# Patient Record
Sex: Male | Born: 1985 | Race: White | Hispanic: No | Marital: Single | Smoking: Current every day smoker
Health system: Southern US, Community
[De-identification: ages and names within clinical notes are randomized; demographics above are authoritative.]

## PROBLEM LIST (undated history)

## (undated) DIAGNOSIS — Z87442 Personal history of urinary calculi: Secondary | ICD-10-CM

## (undated) DIAGNOSIS — F419 Anxiety disorder, unspecified: Secondary | ICD-10-CM

## (undated) DIAGNOSIS — R519 Headache, unspecified: Secondary | ICD-10-CM

## (undated) DIAGNOSIS — T6701XA Heatstroke and sunstroke, initial encounter: Secondary | ICD-10-CM

## (undated) DIAGNOSIS — R51 Headache: Secondary | ICD-10-CM

## (undated) DIAGNOSIS — N200 Calculus of kidney: Secondary | ICD-10-CM

## (undated) DIAGNOSIS — Z8489 Family history of other specified conditions: Secondary | ICD-10-CM

## (undated) HISTORY — PX: KIDNEY STONE SURGERY: SHX686

---
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
## 2006-01-26 ENCOUNTER — Emergency Department: Payer: Self-pay | Admitting: Unknown Physician Specialty

## 2006-01-29 ENCOUNTER — Emergency Department: Payer: Self-pay | Admitting: Emergency Medicine

## 2006-02-02 ENCOUNTER — Emergency Department: Payer: Self-pay

## 2008-02-29 ENCOUNTER — Emergency Department: Payer: Self-pay | Admitting: Emergency Medicine

## 2012-07-13 DIAGNOSIS — T6701XA Heatstroke and sunstroke, initial encounter: Secondary | ICD-10-CM

## 2012-07-13 HISTORY — DX: Heatstroke and sunstroke, initial encounter: T67.01XA

## 2013-11-22 ENCOUNTER — Emergency Department: Payer: Self-pay | Admitting: Emergency Medicine

## 2013-11-22 LAB — URINALYSIS, COMPLETE
Bacteria: NONE SEEN
Bilirubin,UR: NEGATIVE
Blood: NEGATIVE
Glucose,UR: NEGATIVE mg/dL (ref 0–75)
KETONE: NEGATIVE
LEUKOCYTE ESTERASE: NEGATIVE
Nitrite: NEGATIVE
PROTEIN: NEGATIVE
Ph: 5 (ref 4.5–8.0)
Specific Gravity: 1.013 (ref 1.003–1.030)
Squamous Epithelial: <1
WBC UR: 3 /HPF (ref 0–5)

## 2013-11-22 LAB — LIPASE, BLOOD: Lipase: 170 U/L (ref 73–393)

## 2013-11-22 LAB — CBC WITH DIFFERENTIAL/PLATELET
BASOS ABS: 0.1 10*3/uL (ref 0.0–0.1)
Basophil %: 0.8 %
EOS PCT: 1.2 %
Eosinophil #: 0.2 10*3/uL (ref 0.0–0.7)
HCT: 43.3 % (ref 40.0–52.0)
HGB: 14.6 g/dL (ref 13.0–18.0)
Lymphocyte #: 4.3 10*3/uL — ABNORMAL HIGH (ref 1.0–3.6)
Lymphocyte %: 29.3 %
MCH: 29.6 pg (ref 26.0–34.0)
MCHC: 33.8 g/dL (ref 32.0–36.0)
MCV: 88 fL (ref 80–100)
MONOS PCT: 9.5 %
Monocyte #: 1.4 x10 3/mm — ABNORMAL HIGH (ref 0.2–1.0)
NEUTROS PCT: 59.2 %
Neutrophil #: 8.8 10*3/uL — ABNORMAL HIGH (ref 1.4–6.5)
Platelet: 230 10*3/uL (ref 150–440)
RBC: 4.95 10*6/uL (ref 4.40–5.90)
RDW: 12.4 % (ref 11.5–14.5)
WBC: 14.8 10*3/uL — AB (ref 3.8–10.6)

## 2013-11-22 LAB — BASIC METABOLIC PANEL
ANION GAP: 8 (ref 7–16)
BUN: 13 mg/dL (ref 7–18)
CHLORIDE: 104 mmol/L (ref 98–107)
CO2: 25 mmol/L (ref 21–32)
Calcium, Total: 9.2 mg/dL (ref 8.5–10.1)
Creatinine: 0.9 mg/dL (ref 0.60–1.30)
EGFR (African American): >60
EGFR (Non-African Amer.): >60
GLUCOSE: 102 mg/dL — AB (ref 65–99)
Osmolality: 274 (ref 275–301)
POTASSIUM: 3.7 mmol/L (ref 3.5–5.1)
Sodium: 137 mmol/L (ref 136–145)

## 2013-11-22 LAB — TROPONIN I

## 2014-03-19 ENCOUNTER — Emergency Department: Payer: Self-pay | Admitting: Emergency Medicine

## 2014-11-09 ENCOUNTER — Emergency Department
Admit: 2014-11-09 | Disposition: A | Discharge status: Home / Self Care | Payer: Self-pay | Admitting: Emergency Medicine

## 2014-11-09 LAB — COMPREHENSIVE METABOLIC PANEL
ALBUMIN: 4.8 g/dL
ALK PHOS: 87 U/L
ALT: 23 U/L
AST: 26 U/L
Anion Gap: 10 (ref 7–16)
BUN: 12 mg/dL
Bilirubin,Total: 0.8 mg/dL
CALCIUM: 9.6 mg/dL
Chloride: 103 mmol/L
Co2: 24 mmol/L
Creatinine: 1.04 mg/dL
EGFR (African American): >60
EGFR (Non-African Amer.): >60
Glucose: 99 mg/dL
Potassium: 4.3 mmol/L
Sodium: 137 mmol/L
TOTAL PROTEIN: 7.9 g/dL

## 2014-11-09 LAB — URINALYSIS, COMPLETE
BILIRUBIN, UR: NEGATIVE
Bacteria: NONE SEEN
GLUCOSE, UR: NEGATIVE mg/dL (ref 0–75)
KETONE: NEGATIVE
Leukocyte Esterase: NEGATIVE
Nitrite: NEGATIVE
Ph: 5 (ref 4.5–8.0)
Protein: NEGATIVE
SQUAMOUS EPITHELIAL: NONE SEEN
Specific Gravity: 1.026 (ref 1.003–1.030)

## 2014-11-09 LAB — CBC
HCT: 44.7 % (ref 40.0–52.0)
HGB: 14.9 g/dL (ref 13.0–18.0)
MCH: 29.2 pg (ref 26.0–34.0)
MCHC: 33.3 g/dL (ref 32.0–36.0)
MCV: 88 fL (ref 80–100)
Platelet: 243 10*3/uL (ref 150–440)
RBC: 5.1 10*6/uL (ref 4.40–5.90)
RDW: 12.7 % (ref 11.5–14.5)
WBC: 10.8 10*3/uL — ABNORMAL HIGH (ref 3.8–10.6)

## 2014-11-09 LAB — LIPASE, BLOOD: LIPASE: 24 U/L

## 2016-05-06 ENCOUNTER — Emergency Department: Payer: Self-pay

## 2016-05-06 ENCOUNTER — Emergency Department
Admission: EM | Admit: 2016-05-06 | Discharge: 2016-05-06 | Disposition: A | Discharge status: Home / Self Care | Payer: Self-pay | Attending: Emergency Medicine | Admitting: Emergency Medicine

## 2016-05-06 ENCOUNTER — Encounter: Payer: Self-pay | Admitting: Emergency Medicine

## 2016-05-06 DIAGNOSIS — N50812 Left testicular pain: Secondary | ICD-10-CM

## 2016-05-06 DIAGNOSIS — N23 Unspecified renal colic: Secondary | ICD-10-CM | POA: Insufficient documentation

## 2016-05-06 LAB — CBC WITH DIFFERENTIAL/PLATELET
BASOS ABS: 0.1 10*3/uL (ref 0–0.1)
BASOS PCT: 1 %
EOS ABS: 0.2 10*3/uL (ref 0–0.7)
Eosinophils Relative: 2 %
HEMATOCRIT: 39.7 % — AB (ref 40.0–52.0)
Hemoglobin: 13.8 g/dL (ref 13.0–18.0)
Lymphocytes Relative: 30 %
Lymphs Abs: 3.9 10*3/uL — ABNORMAL HIGH (ref 1.0–3.6)
MCH: 30.2 pg (ref 26.0–34.0)
MCHC: 34.7 g/dL (ref 32.0–36.0)
MCV: 86.9 fL (ref 80.0–100.0)
MONO ABS: 1 10*3/uL (ref 0.2–1.0)
Monocytes Relative: 8 %
NEUTROS PCT: 61 %
Neutro Abs: 7.9 10*3/uL — ABNORMAL HIGH (ref 1.4–6.5)
Platelets: 249 10*3/uL (ref 150–440)
RBC: 4.57 MIL/uL (ref 4.40–5.90)
RDW: 12.6 % (ref 11.5–14.5)
WBC: 13 10*3/uL — ABNORMAL HIGH (ref 3.8–10.6)

## 2016-05-06 LAB — URINALYSIS COMPLETE WITH MICROSCOPIC (ARMC ONLY)
BACTERIA UA: NONE SEEN
Bilirubin Urine: NEGATIVE
Glucose, UA: NEGATIVE mg/dL
Ketones, ur: NEGATIVE mg/dL
Nitrite: NEGATIVE
PROTEIN: 30 mg/dL — AB
Specific Gravity, Urine: 1.021 (ref 1.005–1.030)
Squamous Epithelial / LPF: NONE SEEN
pH: 6 (ref 5.0–8.0)

## 2016-05-06 LAB — COMPREHENSIVE METABOLIC PANEL
ALBUMIN: 4.4 g/dL (ref 3.5–5.0)
ALT: 15 U/L — ABNORMAL LOW (ref 17–63)
AST: 22 U/L (ref 15–41)
Alkaline Phosphatase: 79 U/L (ref 38–126)
Anion gap: 9 (ref 5–15)
BILIRUBIN TOTAL: 0.2 mg/dL — AB (ref 0.3–1.2)
BUN: 11 mg/dL (ref 6–20)
CO2: 26 mmol/L (ref 22–32)
Calcium: 9.5 mg/dL (ref 8.9–10.3)
Chloride: 103 mmol/L (ref 101–111)
Creatinine, Ser: 1.01 mg/dL (ref 0.61–1.24)
GFR calc Af Amer: >60 mL/min (ref >60)
GFR calc non Af Amer: >60 mL/min (ref >60)
GLUCOSE: 125 mg/dL — AB (ref 65–99)
Potassium: 4.7 mmol/L (ref 3.5–5.1)
SODIUM: 138 mmol/L (ref 135–145)
Total Protein: 7.5 g/dL (ref 6.5–8.1)

## 2016-05-06 MED ORDER — KETOROLAC TROMETHAMINE 30 MG/ML IJ SOLN
30.0000 mg | Freq: Once | INTRAMUSCULAR | Status: AC
  Administered 2016-05-06: 30 mg via INTRAVENOUS
  Filled 2016-05-06: qty 1

## 2016-05-06 MED ORDER — ONDANSETRON HCL 4 MG PO TABS: 4.0000 mg | ORAL_TABLET | Freq: Every day | ORAL | 1 refills | Status: DC | PRN

## 2016-05-06 MED ORDER — HYDROMORPHONE HCL 1 MG/ML IJ SOLN
1.0000 mg | Freq: Once | INTRAMUSCULAR | Status: AC
  Administered 2016-05-06: 1 mg via INTRAVENOUS
  Filled 2016-05-06: qty 1

## 2016-05-06 MED ORDER — HYDROMORPHONE HCL 1 MG/ML IJ SOLN
0.5000 mg | Freq: Once | INTRAMUSCULAR | Status: AC
  Administered 2016-05-06: 0.5 mg via INTRAVENOUS
  Filled 2016-05-06: qty 1

## 2016-05-06 MED ORDER — SODIUM CHLORIDE 0.9 % IV SOLN
1000.0000 mL | Freq: Once | INTRAVENOUS | Status: AC
  Administered 2016-05-06: 1000 mL via INTRAVENOUS

## 2016-05-06 MED ORDER — ONDANSETRON HCL 4 MG/2ML IJ SOLN
4.0000 mg | Freq: Once | INTRAMUSCULAR | Status: AC
  Administered 2016-05-06: 4 mg via INTRAVENOUS
  Filled 2016-05-06: qty 2

## 2016-05-06 MED ORDER — TAMSULOSIN HCL 0.4 MG PO CAPS: 0.4000 mg | ORAL_CAPSULE | Freq: Every day | ORAL | 0 refills | Status: DC

## 2016-05-06 MED ORDER — OXYCODONE-ACETAMINOPHEN 7.5-325 MG PO TABS: 1.0000 | ORAL_TABLET | ORAL | 0 refills | Status: DC | PRN

## 2016-05-06 NOTE — ED Notes (Signed)
Back from u/s   Family at bedside   Feeling better

## 2016-05-06 NOTE — ED Triage Notes (Signed)
Pt with sudden onset of left testicular pain and left flank pain since 0745. Pt with hx of kidney stones.

## 2016-05-06 NOTE — ED Notes (Signed)
States he developed left sided abd pain this am about 8 am  Positive nausea.. States pain  Moves into left testicle   Denies any trauma

## 2016-05-06 NOTE — ED Notes (Signed)
States pain is returning  MD aware

## 2016-05-06 NOTE — ED Provider Notes (Signed)
[facility] Emergency Department Provider Note        Time seen: ----------------------------------------- 8:28 AM on 05/06/2016 -----------------------------------------    I have reviewed the triage vital signs and the nursing notes.   HISTORY  Chief Complaint Abdominal Pain    HPI Lance Garcia is a 30 y.o. male who presents to the ER for sudden onset left lower quadrant pain that radiates into his testicle at 16. Pain is sharp, nothing makes it better or worse. Patient doesn't history kidney stones, typically the pain is higher up in his flank. Pain is 10 out of 10. He denies other complaints at this time.   Past Medical History:  Diagnosis Date  . Renal disorder     There are no active problems to display for this patient.   No past surgical history on file.  Allergies Review of patient's allergies indicates no known allergies.  Social History Social History  Substance Use Topics  . Smoking status: Not on file  . Smokeless tobacco: Not on file  . Alcohol use Not on file    Review of Systems Constitutional: Negative for fever. Cardiovascular: Negative for chest pain. Respiratory: Negative for shortness of breath. Gastrointestinal: Positive for abdominal pain that radiates into his groin area Genitourinary: Negative for dysuria. Musculoskeletal: Negative for back pain. Skin: Negative for rash. Neurological: Negative for headaches, focal weakness or numbness.  10-point ROS otherwise negative.  ____________________________________________   PHYSICAL EXAM:  VITAL SIGNS: ED Triage Vitals [05/06/16 0825]  Enc Vitals Group     BP (!) 111/55     Pulse Rate 67     Resp 16     Temp 97.4 F (36.3 C)     Temp Source Oral     SpO2 100 %     Weight 160 lb (72.6 kg)     Height 5\' 10"  (1.778 m)     Head Circumference      Peak Flow      Pain Score 10     Pain Loc      Pain Edu?      Excl. in GC?     Constitutional:  Alert and oriented. Mild to moderate distress Eyes: Conjunctivae are normal. PERRL. Normal extraocular movements. ENT   Head: Normocephalic and atraumatic.   Nose: No congestion/rhinnorhea.   Mouth/Throat: Mucous membranes are moist.   Neck: No stridor. Cardiovascular: Normal rate, regular rhythm. No murmurs, rubs, or gallops. Respiratory: Normal respiratory effort without tachypnea nor retractions. Breath sounds are clear and equal bilaterally. No wheezes/rales/rhonchi. Gastrointestinal: Left lower quadrant tenderness, no rebound or guarding. Normal bowel sounds. Genitourinary: Musculoskeletal: Nontender with normal range of motion in all extremities. No lower extremity tenderness nor edema. Neurologic:  Normal speech and language. No gross focal neurologic deficits are appreciated.  Skin:  Skin is warm, dry and intact. No rash noted. Psychiatric: Mood and affect are normal. Speech and behavior are normal.  ____________________________________________  ED COURSE:  Pertinent labs & imaging results that were available during my care of the patient were reviewed by me and considered in my medical decision making (see chart for details). Clinical Course  Patient presents to the ER and significant pain likely from renal colic. We will assess with basic labs and imaging.  Procedures ____________________________________________   LABS (pertinent positives/negatives)  Labs Reviewed  CBC WITH DIFFERENTIAL/PLATELET - Abnormal; Notable for the following:       Result Value   WBC 13.0 (*)    HCT 39.7 (*)  Neutro Abs 7.9 (*)    Lymphs Abs 3.9 (*)    All other components within normal limits  COMPREHENSIVE METABOLIC PANEL - Abnormal; Notable for the following:    Glucose, Bld 125 (*)    ALT 15 (*)    Total Bilirubin 0.2 (*)    All other components within normal limits  URINALYSIS COMPLETEWITH MICROSCOPIC ([facility] ONLY) - Abnormal; Notable for the following:    Color, Urine  YELLOW (*)    APPearance CLOUDY (*)    Hgb urine dipstick 3+ (*)    Protein, ur 30 (*)    Leukocytes, UA TRACE (*)    All other components within normal limits    RADIOLOGY  KUB IMPRESSION: Negative. IMPRESSION: 1. Normal sonographic appearance of both testicles. Patent blood flow. 2. Small right-sided epididymal cyst. 3. Small right hydrocele. ____________________________________________  FINAL ASSESSMENT AND PLAN  Renal colic  Plan: Patient with labs and imaging as dictated above. Patient with symptoms of renal colic, likely a small stone that cannot be visualized on KUB. He'll be discharged pain medicine and referred to outpatient follow-up with urology.   Emily FilbertWilliams, Jonathan E, MD   Note: This dictation was prepared with Dragon dictation. Any transcriptional errors that result from this process are unintentional    Emily FilbertJonathan E Williams, MD 05/06/16 1124

## 2016-06-21 ENCOUNTER — Encounter: Payer: Self-pay | Admitting: Emergency Medicine

## 2016-06-21 DIAGNOSIS — N200 Calculus of kidney: Secondary | ICD-10-CM | POA: Insufficient documentation

## 2016-06-21 DIAGNOSIS — F17210 Nicotine dependence, cigarettes, uncomplicated: Secondary | ICD-10-CM | POA: Insufficient documentation

## 2016-06-21 MED ORDER — SODIUM CHLORIDE 0.9 % IV BOLUS (SEPSIS)
1000.0000 mL | Freq: Once | INTRAVENOUS | Status: AC
  Administered 2016-06-21: 1000 mL via INTRAVENOUS

## 2016-06-21 MED ORDER — ONDANSETRON HCL 4 MG/2ML IJ SOLN
4.0000 mg | Freq: Once | INTRAMUSCULAR | Status: AC
  Administered 2016-06-21: 4 mg via INTRAVENOUS
  Filled 2016-06-21: qty 2

## 2016-06-21 MED ORDER — KETOROLAC TROMETHAMINE 30 MG/ML IJ SOLN
30.0000 mg | Freq: Once | INTRAMUSCULAR | Status: AC
  Administered 2016-06-21: 30 mg via INTRAVENOUS
  Filled 2016-06-21: qty 1

## 2016-06-21 NOTE — ED Triage Notes (Signed)
Pt reports left flank and left lower quadrant pain for about 90 minutes; N/V x 2; pt says he did have some mild back pain last week; denies urinary s/s; history of kidney stones

## 2016-06-22 ENCOUNTER — Emergency Department: Payer: Self-pay

## 2016-06-22 ENCOUNTER — Emergency Department
Admission: EM | Admit: 2016-06-22 | Discharge: 2016-06-22 | Disposition: A | Discharge status: Home / Self Care | Payer: Self-pay | Attending: Emergency Medicine | Admitting: Emergency Medicine

## 2016-06-22 DIAGNOSIS — R1032 Left lower quadrant pain: Secondary | ICD-10-CM

## 2016-06-22 DIAGNOSIS — N200 Calculus of kidney: Secondary | ICD-10-CM

## 2016-06-22 LAB — URINALYSIS, COMPLETE (UACMP) WITH MICROSCOPIC
Bilirubin Urine: NEGATIVE
Glucose, UA: NEGATIVE mg/dL
KETONES UR: NEGATIVE mg/dL
Nitrite: NEGATIVE
PH: 5 (ref 5.0–8.0)
PROTEIN: 100 mg/dL — AB
SQUAMOUS EPITHELIAL / LPF: NONE SEEN
Specific Gravity, Urine: 1.027 (ref 1.005–1.030)

## 2016-06-22 LAB — BASIC METABOLIC PANEL
Anion gap: 6 (ref 5–15)
BUN: 20 mg/dL (ref 6–20)
CHLORIDE: 106 mmol/L (ref 101–111)
CO2: 24 mmol/L (ref 22–32)
Calcium: 9 mg/dL (ref 8.9–10.3)
Creatinine, Ser: 0.91 mg/dL (ref 0.61–1.24)
GFR calc non Af Amer: >60 mL/min (ref >60)
Glucose, Bld: 107 mg/dL — ABNORMAL HIGH (ref 65–99)
POTASSIUM: 3.9 mmol/L (ref 3.5–5.1)
Sodium: 136 mmol/L (ref 135–145)

## 2016-06-22 LAB — CBC
HCT: 38.7 % — ABNORMAL LOW (ref 40.0–52.0)
HEMOGLOBIN: 13.2 g/dL (ref 13.0–18.0)
MCH: 29.4 pg (ref 26.0–34.0)
MCHC: 34 g/dL (ref 32.0–36.0)
MCV: 86.4 fL (ref 80.0–100.0)
Platelets: 240 10*3/uL (ref 150–440)
RBC: 4.47 MIL/uL (ref 4.40–5.90)
RDW: 12.6 % (ref 11.5–14.5)
WBC: 22.2 10*3/uL — ABNORMAL HIGH (ref 3.8–10.6)

## 2016-06-22 MED ORDER — MORPHINE SULFATE (PF) 4 MG/ML IV SOLN
4.0000 mg | Freq: Once | INTRAVENOUS | Status: AC
  Administered 2016-06-22: 4 mg via INTRAVENOUS
  Filled 2016-06-22: qty 1

## 2016-06-22 MED ORDER — HYDROCODONE-ACETAMINOPHEN 5-325 MG PO TABS: 1.0000 | ORAL_TABLET | ORAL | 0 refills | Status: DC | PRN

## 2016-06-22 MED ORDER — ONDANSETRON 4 MG PO TBDP: 4.0000 mg | ORAL_TABLET | Freq: Three times a day (TID) | ORAL | 0 refills | Status: DC | PRN

## 2016-06-22 MED ORDER — ONDANSETRON HCL 4 MG/2ML IJ SOLN
4.0000 mg | Freq: Once | INTRAMUSCULAR | Status: AC
  Administered 2016-06-22: 4 mg via INTRAVENOUS
  Filled 2016-06-22: qty 2

## 2016-06-22 MED ORDER — SODIUM CHLORIDE 0.9 % IV BOLUS (SEPSIS)
1000.0000 mL | Freq: Once | INTRAVENOUS | Status: AC
  Administered 2016-06-22: 1000 mL via INTRAVENOUS

## 2016-06-22 NOTE — ED Provider Notes (Signed)
[facility] Emergency Department Provider Note   ____________________________________________   First MD Initiated Contact with Patient 06/22/16 0106     (approximate)  I have reviewed the triage vital signs and the nursing notes.   HISTORY  Chief Complaint Abdominal Pain and Flank Pain    HPI Lance Garcia is a 30 y.o. male who comes into the hospital today with some pain in his kidney. The patient reports he has pain in his left upper quadrant all the way down to his left testicle. He reports that it start around 9 PM today. Earlier this week the patient and having some back pain and was urinating bright red blood. The patient was here on October 25 and was evaluated but they did not see a kidney stone. He reports that he has not yet passed the stone. He has no insurance and he is unable to be seen by urology. He tried taking Percocet today for the pain but it made him vomit The patient rates his pain a 5 out of 10 in intensity currently and reports that mainly in his left lower quadrant. He's had no fevers but he is here today for evaluation.   Past Medical History:  Diagnosis Date  . Kidney stones   . Renal disorder     There are no active problems to display for this patient.   Past Surgical History:  Procedure Laterality Date  . KIDNEY STONE SURGERY      Prior to Admission medications   Medication Sig Start Date End Date Taking? Authorizing Provider  HYDROcodone-acetaminophen (NORCO/VICODIN) 5-325 MG tablet Take 1 tablet by mouth every 4 (four) hours as needed for moderate pain. 06/22/16 06/22/17  Rebecka ApleyAllison P Webster, MD  ondansetron (ZOFRAN ODT) 4 MG disintegrating tablet Take 1 tablet (4 mg total) by mouth every 8 (eight) hours as needed for nausea or vomiting. 06/22/16   Rebecka ApleyAllison P Webster, MD    Allergies Patient has no known allergies.  History reviewed. No pertinent family history.  Social History Social History  Substance Use  Topics  . Smoking status: Current Every Day Smoker    Packs/day: 1.00    Types: Cigarettes  . Smokeless tobacco: Never Used  . Alcohol use No    Review of Systems Constitutional: No fever/chills Eyes: No visual changes. ENT: No sore throat. Cardiovascular: Denies chest pain. Respiratory: Denies shortness of breath. Gastrointestinal:  abdominal pain, nausea, vomiting.  No diarrhea.  No constipation. Genitourinary: hematuria Musculoskeletal: back pain. Skin: Negative for rash. Neurological: Negative for headaches, focal weakness or numbness.  10-point ROS otherwise negative.  ____________________________________________   PHYSICAL EXAM:  VITAL SIGNS: ED Triage Vitals  Enc Vitals Group     BP 06/21/16 2308 119/69     Pulse Rate 06/21/16 2308 80     Resp 06/21/16 2308 20     Temp 06/21/16 2308 97.7 F (36.5 C)     Temp Source 06/21/16 2308 Oral     SpO2 06/21/16 2308 96 %     Weight 06/21/16 2307 150 lb (68 kg)     Height 06/21/16 2307 5\' 8"  (1.727 m)     Head Circumference --      Peak Flow --      Pain Score 06/22/16 0002 4     Pain Loc --      Pain Edu? --      Excl. in GC? --     Constitutional: Alert and oriented. Well appearing and in moderate distress.  Eyes: Conjunctivae are normal. PERRL. EOMI. Head: Atraumatic. Nose: No congestion/rhinnorhea. Mouth/Throat: Mucous membranes are moist.  Oropharynx non-erythematous. Cardiovascular: Normal rate, regular rhythm. Grossly normal heart sounds.  Good peripheral circulation. Respiratory: Normal respiratory effort.  No retractions. Lungs CTAB. Gastrointestinal: Soft with some LLQ abd pain to palpation. No distention. Positive bowel sounds Musculoskeletal: No lower extremity tenderness nor edema.   Neurologic:  Normal speech and language. Positive bowel sounds Skin:  Skin is warm, dry and intact.  Psychiatric: Mood and affect are normal.   ____________________________________________   LABS (all labs ordered  are listed, but only abnormal results are displayed)  Labs Reviewed  URINALYSIS, COMPLETE (UACMP) WITH MICROSCOPIC - Abnormal; Notable for the following:       Result Value   Color, Urine YELLOW (*)    APPearance CLOUDY (*)    Hgb urine dipstick LARGE (*)    Protein, ur 100 (*)    Leukocytes, UA TRACE (*)    Bacteria, UA RARE (*)    All other components within normal limits  CBC - Abnormal; Notable for the following:    WBC 22.2 (*)    HCT 38.7 (*)    All other components within normal limits  BASIC METABOLIC PANEL - Abnormal; Notable for the following:    Glucose, Bld 107 (*)    All other components within normal limits   ____________________________________________  EKG  none ____________________________________________  RADIOLOGY  CT renal stone ____________________________________________   PROCEDURES  Procedure(s) performed: None  Procedures  Critical Care performed: No  ____________________________________________   INITIAL IMPRESSION / ASSESSMENT AND PLAN / ED COURSE  Pertinent labs & imaging results that were available during my care of the patient were reviewed by me and considered in my medical decision making (see chart for details).  This is a 30 year old male who comes into the hospital today with some left lower quadrant abdominal pain. The patient is concerned he might have a kidney stone. He has had kidney stones in the past. He did receive an initial liter of normal saline as well as some Toradol. I will give the patient some morphine and Zofran as well as another liter of normal saline. I will send the patient for a CT scan and await the results of his urinalysis. I will reassess the patient once I received his imaging studies in his urinalysis.   Clinical Course as of Jun 22 246  Mon Jun 22, 2016  0240 A 5 mm distal left ureteral stone.  No hydronephrosis. CT Renal [facility] [AW]    Clinical Course User Index [AW] Rebecka Apley, MD    The patient's CT shows a 5 mm distal left ureteral stone. The patient's pain is improved after the dose of morphine. I will encourage the patient again to follow-up with urology. I will give the patient hydrocodone as they feel the Percocet is too strong and I will give him some Zofran. The patient will be discharged home.  ____________________________________________   FINAL CLINICAL IMPRESSION(S) / ED DIAGNOSES  Final diagnoses:  Kidney stone  Left lower quadrant pain      NEW MEDICATIONS STARTED DURING THIS VISIT:  New Prescriptions   HYDROCODONE-ACETAMINOPHEN (NORCO/VICODIN) 5-325 MG TABLET    Take 1 tablet by mouth every 4 (four) hours as needed for moderate pain.   ONDANSETRON (ZOFRAN ODT) 4 MG DISINTEGRATING TABLET    Take 1 tablet (4 mg total) by mouth every 8 (eight) hours as needed for nausea or vomiting.  Note:  This document was prepared using Dragon voice recognition software and may include unintentional dictation errors.    Rebecka ApleyAllison P Webster, MD 06/22/16 [phone]

## 2016-07-23 ENCOUNTER — Telehealth: Payer: Self-pay | Admitting: Urology

## 2016-07-23 ENCOUNTER — Encounter: Payer: Self-pay | Admitting: *Deleted

## 2016-07-23 ENCOUNTER — Encounter: Payer: Self-pay | Admitting: Emergency Medicine

## 2016-07-23 ENCOUNTER — Emergency Department: Payer: Self-pay

## 2016-07-23 ENCOUNTER — Emergency Department
Admission: EM | Admit: 2016-07-23 | Discharge: 2016-07-23 | Disposition: A | Discharge status: Home / Self Care | Payer: Self-pay | Attending: Emergency Medicine | Admitting: Emergency Medicine

## 2016-07-23 DIAGNOSIS — N23 Unspecified renal colic: Secondary | ICD-10-CM | POA: Insufficient documentation

## 2016-07-23 DIAGNOSIS — F17210 Nicotine dependence, cigarettes, uncomplicated: Secondary | ICD-10-CM | POA: Insufficient documentation

## 2016-07-23 DIAGNOSIS — R112 Nausea with vomiting, unspecified: Secondary | ICD-10-CM | POA: Insufficient documentation

## 2016-07-23 DIAGNOSIS — Z5321 Procedure and treatment not carried out due to patient leaving prior to being seen by health care provider: Secondary | ICD-10-CM | POA: Insufficient documentation

## 2016-07-23 LAB — URINALYSIS, COMPLETE (UACMP) WITH MICROSCOPIC
BACTERIA UA: NONE SEEN
Bilirubin Urine: NEGATIVE
GLUCOSE, UA: NEGATIVE mg/dL
KETONES UR: 5 mg/dL — AB
NITRITE: NEGATIVE
PROTEIN: 30 mg/dL — AB
Specific Gravity, Urine: 1.028 (ref 1.005–1.030)
pH: 5 (ref 5.0–8.0)

## 2016-07-23 LAB — COMPREHENSIVE METABOLIC PANEL
ALBUMIN: 4.6 g/dL (ref 3.5–5.0)
ALT: 18 U/L (ref 17–63)
AST: 24 U/L (ref 15–41)
Alkaline Phosphatase: 82 U/L (ref 38–126)
Anion gap: 7 (ref 5–15)
BUN: 14 mg/dL (ref 6–20)
CALCIUM: 10.1 mg/dL (ref 8.9–10.3)
CO2: 29 mmol/L (ref 22–32)
Chloride: 103 mmol/L (ref 101–111)
Creatinine, Ser: 1.24 mg/dL (ref 0.61–1.24)
GFR calc non Af Amer: >60 mL/min (ref >60)
Glucose, Bld: 122 mg/dL — ABNORMAL HIGH (ref 65–99)
POTASSIUM: 3.8 mmol/L (ref 3.5–5.1)
Sodium: 139 mmol/L (ref 135–145)
TOTAL PROTEIN: 8 g/dL (ref 6.5–8.1)
Total Bilirubin: <0.1 mg/dL — ABNORMAL LOW (ref 0.3–1.2)

## 2016-07-23 LAB — CBC
HEMATOCRIT: 41.6 % (ref 40.0–52.0)
HEMOGLOBIN: 14.3 g/dL (ref 13.0–18.0)
MCH: 29.4 pg (ref 26.0–34.0)
MCHC: 34.4 g/dL (ref 32.0–36.0)
MCV: 85.5 fL (ref 80.0–100.0)
Platelets: 246 10*3/uL (ref 150–440)
RBC: 4.86 MIL/uL (ref 4.40–5.90)
RDW: 12.6 % (ref 11.5–14.5)
WBC: 11.9 10*3/uL — ABNORMAL HIGH (ref 3.8–10.6)

## 2016-07-23 LAB — LIPASE, BLOOD: Lipase: 57 U/L — ABNORMAL HIGH (ref 11–51)

## 2016-07-23 MED ORDER — CIPROFLOXACIN HCL 500 MG PO TABS
500.0000 mg | ORAL_TABLET | Freq: Once | ORAL | Status: AC
  Administered 2016-07-23: 500 mg via ORAL
  Filled 2016-07-23: qty 1

## 2016-07-23 MED ORDER — SODIUM CHLORIDE 0.9 % IV BOLUS (SEPSIS)
1000.0000 mL | Freq: Once | INTRAVENOUS | Status: AC
  Administered 2016-07-23: 1000 mL via INTRAVENOUS

## 2016-07-23 MED ORDER — ONDANSETRON HCL 4 MG/2ML IJ SOLN
4.0000 mg | Freq: Once | INTRAMUSCULAR | Status: AC
  Administered 2016-07-23: 4 mg via INTRAVENOUS
  Filled 2016-07-23: qty 2

## 2016-07-23 MED ORDER — TAMSULOSIN HCL 0.4 MG PO CAPS
0.4000 mg | ORAL_CAPSULE | Freq: Once | ORAL | Status: AC
  Administered 2016-07-23: 0.4 mg via ORAL
  Filled 2016-07-23: qty 1

## 2016-07-23 MED ORDER — CIPROFLOXACIN HCL 500 MG PO TABS: 500.0000 mg | ORAL_TABLET | Freq: Two times a day (BID) | ORAL | 0 refills | Status: DC

## 2016-07-23 MED ORDER — MORPHINE SULFATE (PF) 4 MG/ML IV SOLN
4.0000 mg | Freq: Once | INTRAVENOUS | Status: AC
  Administered 2016-07-23: 4 mg via INTRAVENOUS

## 2016-07-23 MED ORDER — ONDANSETRON HCL 4 MG/2ML IJ SOLN
INTRAMUSCULAR | Status: AC
  Administered 2016-07-23: 4 mg via INTRAVENOUS
  Filled 2016-07-23: qty 2

## 2016-07-23 MED ORDER — OXYCODONE-ACETAMINOPHEN 10-325 MG PO TABS: 1.0000 | ORAL_TABLET | Freq: Four times a day (QID) | ORAL | 0 refills | Status: DC | PRN

## 2016-07-23 MED ORDER — HYDROMORPHONE HCL 1 MG/ML IJ SOLN
1.0000 mg | Freq: Once | INTRAMUSCULAR | Status: AC
  Administered 2016-07-23: 1 mg via INTRAVENOUS
  Filled 2016-07-23: qty 1

## 2016-07-23 MED ORDER — MORPHINE SULFATE (PF) 4 MG/ML IV SOLN
INTRAVENOUS | Status: AC
  Administered 2016-07-23: 4 mg via INTRAVENOUS
  Filled 2016-07-23: qty 1

## 2016-07-23 MED ORDER — ONDANSETRON 4 MG PO TBDP: 4.0000 mg | ORAL_TABLET | Freq: Three times a day (TID) | ORAL | 0 refills | Status: DC | PRN

## 2016-07-23 MED ORDER — KETOROLAC TROMETHAMINE 30 MG/ML IJ SOLN
INTRAMUSCULAR | Status: AC
  Administered 2016-07-23: 30 mg via INTRAVENOUS
  Filled 2016-07-23: qty 1

## 2016-07-23 MED ORDER — ONDANSETRON HCL 4 MG/2ML IJ SOLN
4.0000 mg | Freq: Once | INTRAMUSCULAR | Status: AC
  Administered 2016-07-23: 4 mg via INTRAVENOUS

## 2016-07-23 MED ORDER — TAMSULOSIN HCL 0.4 MG PO CAPS: 0.4000 mg | ORAL_CAPSULE | Freq: Every day | ORAL | 0 refills | Status: DC

## 2016-07-23 MED ORDER — KETOROLAC TROMETHAMINE 30 MG/ML IJ SOLN
30.0000 mg | Freq: Once | INTRAMUSCULAR | Status: AC
  Administered 2016-07-23: 30 mg via INTRAVENOUS

## 2016-07-23 NOTE — Discharge Instructions (Signed)
1. Take pain & nausea medicines as needed (Percocet/Zofran #20). Make sure to take a stool softener while taking narcotic pain medicines. 2. Take Flomax 0.4mg  daily x 14 days. 3. Take antibiotic as prescribed (Cipro 500 mg twice daily 7 days). 4. Drink plenty of bottled or filtered water daily. 5. Return to the ER for worsening symptoms, persistent vomiting, fever, difficulty breathing or other concerns.

## 2016-07-23 NOTE — ED Notes (Signed)
Pt offered zofran but pt refused

## 2016-07-23 NOTE — ED Triage Notes (Addendum)
Pt presents to ED via wheelchair with c.o LLQ pain beginning about midnight. Pt was seen here last month and dx with kidney stone. Pt bent over in wheelchair, in severe pain.

## 2016-07-23 NOTE — ED Notes (Signed)
ED Provider at bedside. 

## 2016-07-23 NOTE — ED Triage Notes (Signed)
Pt was seen yesterday and discharged this AM with diagnosis of a kidney stone and UTI, pt states he continues to have pain and vomiting, pt has not taken any of the medication he was sent home with

## 2016-07-23 NOTE — ED Provider Notes (Signed)
[facility] Emergency Department Provider Note   ____________________________________________   First MD Initiated Contact with Patient 07/23/16 [phone]     (approximate)  I have reviewed the triage vital signs and the nursing notes.   HISTORY  Chief Complaint Abdominal Pain    HPI Lance Garcia is a 31 y.o. male who presents to the ED from home with a chief complaint of left lower quadrant abdominal pain. Patient was seen in the ED 12/11 with similar symptoms and diagnosed with 5 mm left distal ureteral stone seen on CT scan. Patient has had a history of kidney stones previously requiring surgery. Reports intermittent pain with current stone since last ED visit. Has not had urology follow-up. Reports sudden increase in pain approximately midnight associated with nausea and vomiting. Denies hematuria but states he was "dribbling urine". Denies associated fever, chills, chest pain, shortness of breath, diarrhea.Denies recent travel or trauma. Nothing makes his symptoms better or worse.   Past Medical History:  Diagnosis Date  . Kidney stones   . Renal disorder     There are no active problems to display for this patient.   Past Surgical History:  Procedure Laterality Date  . KIDNEY STONE SURGERY      Prior to Admission medications   Medication Sig Start Date End Date Taking? Authorizing Provider  ciprofloxacin (CIPRO) 500 MG tablet Take 1 tablet (500 mg total) by mouth 2 (two) times daily. 07/23/16   Irean HongJade J Hayes Rehfeldt, MD  HYDROcodone-acetaminophen (NORCO/VICODIN) 5-325 MG tablet Take 1 tablet by mouth every 4 (four) hours as needed for moderate pain. 06/22/16 06/22/17  Rebecka ApleyAllison P Webster, MD  ondansetron (ZOFRAN ODT) 4 MG disintegrating tablet Take 1 tablet (4 mg total) by mouth every 8 (eight) hours as needed for nausea or vomiting. 07/23/16   Irean HongJade J Saladin Petrelli, MD  oxyCODONE-acetaminophen (PERCOCET) 10-325 MG tablet Take 1 tablet by mouth every 6 (six) hours as  needed for pain. 07/23/16   Irean HongJade J Herman Fiero, MD  tamsulosin (FLOMAX) 0.4 MG CAPS capsule Take 1 capsule (0.4 mg total) by mouth daily. 07/23/16   Irean HongJade J Kalya Troeger, MD    Allergies Patient has no known allergies.  No family history on file.  Social History Social History  Substance Use Topics  . Smoking status: Current Every Day Smoker    Packs/day: 1.00    Types: Cigarettes  . Smokeless tobacco: Never Used  . Alcohol use No    Review of Systems  Constitutional: No fever/chills. Eyes: No visual changes. ENT: No sore throat. Cardiovascular: Denies chest pain. Respiratory: Denies shortness of breath. Gastrointestinal: Positive for abdominal pain.  Positive for nausea and vomiting.  No diarrhea.  No constipation. Genitourinary: Positive for hesitancy. Negative for dysuria. Musculoskeletal: Negative for back pain. Skin: Negative for rash. Neurological: Negative for headaches, focal weakness or numbness.  10-point ROS otherwise negative.  ____________________________________________   PHYSICAL EXAM:  VITAL SIGNS: ED Triage Vitals  Enc Vitals Group     BP 07/23/16 0225 120/83     Pulse Rate 07/23/16 0225 73     Resp 07/23/16 0225 18     Temp 07/23/16 0225 97.4 F (36.3 C)     Temp Source 07/23/16 0225 Oral     SpO2 07/23/16 0225 100 %     Weight 07/23/16 0226 150 lb (68 kg)     Height 07/23/16 0226 5\' 8"  (1.727 m)     Head Circumference --      Peak Flow --  Pain Score 07/23/16 0227 10     Pain Loc --      Pain Edu? --      Excl. in GC? --     Constitutional: Alert and oriented. Well appearing and in mild acute distress. Eyes: Conjunctivae are normal. PERRL. EOMI. Head: Atraumatic. Nose: No congestion/rhinnorhea. Mouth/Throat: Mucous membranes are moist.  Oropharynx non-erythematous. Neck: No stridor.   Cardiovascular: Normal rate, regular rhythm. Grossly normal heart sounds.  Good peripheral circulation. Respiratory: Normal respiratory effort.  No retractions.  Lungs CTAB. Gastrointestinal: Soft and mildly tender to palpation left pelvis without rebound or guarding. No distention. No abdominal bruits. No CVA tenderness. Musculoskeletal: No lower extremity tenderness nor edema.  No joint effusions. Neurologic:  Normal speech and language. No gross focal neurologic deficits are appreciated. No gait instability. Skin:  Skin is warm, dry and intact. No rash noted. Psychiatric: Mood and affect are normal. Speech and behavior are normal.  ____________________________________________   LABS (all labs ordered are listed, but only abnormal results are displayed)  Labs Reviewed  LIPASE, BLOOD - Abnormal; Notable for the following:       Result Value   Lipase 57 (*)    All other components within normal limits  COMPREHENSIVE METABOLIC PANEL - Abnormal; Notable for the following:    Glucose, Bld 122 (*)    Total Bilirubin <0.1 (*)    All other components within normal limits  CBC - Abnormal; Notable for the following:    WBC 11.9 (*)    All other components within normal limits  URINALYSIS, COMPLETE (UACMP) WITH MICROSCOPIC - Abnormal; Notable for the following:    Color, Urine YELLOW (*)    APPearance CLEAR (*)    Hgb urine dipstick SMALL (*)    Ketones, ur 5 (*)    Protein, ur 30 (*)    Leukocytes, UA MODERATE (*)    Squamous Epithelial / LPF 0-5 (*)    All other components within normal limits   ____________________________________________  EKG  None ____________________________________________  RADIOLOGY  KUB (viewed by me, interpreted per Dr. Andria Meuse): Calculus in the left pelvis likely representing a distal left  ureteral stone. Similar position to previous CT.   ____________________________________________   PROCEDURES  Procedure(s) performed: None  Procedures  Critical Care performed: No  ____________________________________________   INITIAL IMPRESSION / ASSESSMENT AND PLAN / ED COURSE  Pertinent labs & imaging  results that were available during my care of the patient were reviewed by me and considered in my medical decision making (see chart for details).  31 year old male who presents with left lower quadrant abdominal pain which is similar to his presentation on 06/22/2016; 5 mm distal left ureteral stone without hydronephrosis seen on CT scan at that visit. Will initiate IV fluid resuscitation, analgesia, antiemetic, check screening lab work, urinalysis and reassess.  Clinical Course as of Jul 23 749  Thu Jul 23, 2016  1610 Patient is pain free and eager for discharge. Will follow-up with urology closely. Will continue Cipro empirically for moderate leukocytes seen and urinalysis. Noted very mild elevation in lipase which I believe is not clinically related to patient's symptoms this morning. Strict return precautions given. Patient and mother verbalize understanding and agree with plan of care.  [JS]    Clinical Course User Index [JS] Irean Hong, MD     ____________________________________________   FINAL CLINICAL IMPRESSION(S) / ED DIAGNOSES  Final diagnoses:  Renal colic on left side      NEW MEDICATIONS STARTED  DURING THIS VISIT:  Discharge Medication List as of 07/23/2016  6:02 AM    START taking these medications   Details  ciprofloxacin (CIPRO) 500 MG tablet Take 1 tablet (500 mg total) by mouth 2 (two) times daily., Starting Thu 07/23/2016, Print    oxyCODONE-acetaminophen (PERCOCET) 10-325 MG tablet Take 1 tablet by mouth every 6 (six) hours as needed for pain., Starting Thu 07/23/2016, Print    tamsulosin (FLOMAX) 0.4 MG CAPS capsule Take 1 capsule (0.4 mg total) by mouth daily., Starting Thu 07/23/2016, Print         Note:  This document was prepared using Dragon voice recognition software and may include unintentional dictation errors.    Irean Hong, MD 07/23/16 [id number]

## 2016-07-23 NOTE — ED Notes (Signed)
Discharge instructions reviewed with patient. Questions fielded by this RN. Patient verbalizes understanding of instructions. Patient discharged home in stable condition per Sung MD . No acute distress noted at time of discharge.   

## 2016-07-23 NOTE — Telephone Encounter (Signed)
appt made and pat is aware  Lance DusterMichelle

## 2016-07-28 ENCOUNTER — Ambulatory Visit (INDEPENDENT_AMBULATORY_CARE_PROVIDER_SITE_OTHER): Payer: Self-pay | Admitting: Urology

## 2016-07-28 ENCOUNTER — Encounter: Payer: Self-pay | Admitting: Urology

## 2016-07-28 VITALS — BP 138/85 | HR 77 | Ht 69.0 in | Wt 146.0 lb

## 2016-07-28 DIAGNOSIS — N179 Acute kidney failure, unspecified: Secondary | ICD-10-CM

## 2016-07-28 DIAGNOSIS — N201 Calculus of ureter: Secondary | ICD-10-CM

## 2016-07-28 DIAGNOSIS — K920 Hematemesis: Secondary | ICD-10-CM

## 2016-07-28 NOTE — Progress Notes (Signed)
07/28/2016 3:18 PM   Lance Garcia [birth year] [id number]  Referring provider: No referring provider defined for this encounter.  Chief Complaint  Patient presents with  . Nephrolithiasis    New Patient    HPI: 31 year old male who presents the office today to discuss management for his 5 mm left distal ureteral stone. He was seen in the emergency room on 05/06/2016 and again 2 additional times on 06/22/2016 and 07/23/2016 after the stone failed to pass.  Initially on presentation, he did have a fairly significant leukocytosis which has normalized down to 1.9 from 22.2 a little over a month ago.  He's also noted to have some mild acute kidney injury, creatinine elevated to 1.24 from baseline of 0.9.  Urinalysis only showed evidence of microscopic hematuria, otherwise unremarkable.  He continues to have considerable flank pain/ left lower quadrant pain. He is avoiding narcotics as possible as he cannot take them while working. He has had nausea and vomiting and did see blood in his emesis around the time of his ER visit. This seems to be improving. No fevers or chills.  No dysuria or gross hematuria.  He does have personal history of stones in 2005 requiring ureteroscopy by Dr. Lonna Cobb.  He was told that it had imbedded into the wall of the ureter.  I was on the left side.  He did also spontaneously past a small left sided stone 3 years ago.     PMH: Past Medical History:  Diagnosis Date  . GERD (gastroesophageal reflux disease)   . Kidney stones   . Renal disorder     Surgical History: Past Surgical History:  Procedure Laterality Date  . KIDNEY STONE SURGERY      Home Medications:  Allergies as of 07/28/2016   No Known Allergies     Medication List       Accurate as of 07/28/16 11:59 PM. Always use your most recent med list.          ciprofloxacin 500 MG tablet Commonly known as:  CIPRO Take 1 tablet (500 mg total) by mouth 2 (two) times daily.   ondansetron  4 MG disintegrating tablet Commonly known as:  ZOFRAN ODT Take 1 tablet (4 mg total) by mouth every 8 (eight) hours as needed for nausea or vomiting.   oxyCODONE-acetaminophen 10-325 MG tablet Commonly known as:  PERCOCET Take 1 tablet by mouth every 6 (six) hours as needed for pain.   tamsulosin 0.4 MG Caps capsule Commonly known as:  FLOMAX Take 1 capsule (0.4 mg total) by mouth daily.       Allergies: No Known Allergies  Family History: No family history on file.  Social History:  reports that he has been smoking Cigarettes.  He has been smoking about 1.00 pack per day. He has never used smokeless tobacco. He reports that he does not drink alcohol. His drug history is not on file.  ROS: UROLOGY Frequent Urination?: No Hard to postpone urination?: Yes Burning/pain with urination?: Yes Get up at night to urinate?: Yes Leakage of urine?: No Urine stream starts and stops?: Yes Trouble starting stream?: No Do you have to strain to urinate?: No Blood in urine?: Yes Urinary tract infection?: Yes Sexually transmitted disease?: No Injury to kidneys or bladder?: No Painful intercourse?: No Weak stream?: Yes Erection problems?: No Penile pain?: No  Gastrointestinal Nausea?: Yes Vomiting?: Yes Indigestion/heartburn?: Yes Diarrhea?: No Constipation?: No  Constitutional Fever: Yes Night sweats?: Yes Weight loss?: Yes Fatigue?: Yes  Skin  Skin rash/lesions?: No Itching?: No  Eyes Blurred vision?: No Double vision?: No  Ears/Nose/Throat Sore throat?: No Sinus problems?: No  Hematologic/Lymphatic Swollen glands?: No Easy bruising?: No  Cardiovascular Leg swelling?: No Chest pain?: No  Respiratory Cough?: No Shortness of breath?: Yes  Endocrine Excessive thirst?: Yes  Musculoskeletal Back pain?: Yes Joint pain?: No  Neurological Headaches?: Yes Dizziness?: No  Psychologic Depression?: No Anxiety?: No  Physical Exam: BP 138/85   Pulse 77    Ht 5\' 9"  (1.753 m)   Wt 146 lb (66.2 kg)   BMI 21.56 kg/m   Constitutional:  Alert and oriented, No acute distress.  Accompanied by fianc. HEENT: [place] AT, moist mucus membranes.  Trachea midline, no masses. Cardiovascular: No clubbing, cyanosis, or edema. RRR. Respiratory: Normal respiratory effort, no increased work of breathing. CTAB. GI: Abdomen is soft, nontender, nondistended, no abdominal masses GU: Mild left CVA tenderness.   Skin: No rashes, bruises or suspicious lesions. Neurologic: Grossly intact, no focal deficits, moving all 4 extremities. Psychiatric: Normal mood and affect.  Laboratory Data: Lab Results  Component Value Date   WBC 11.9 (H) 07/23/2016   HGB 14.3 07/23/2016   HCT 41.6 07/23/2016   MCV 85.5 07/23/2016   PLT 246 07/23/2016    Lab Results  Component Value Date   CREATININE 1.24 07/23/2016   Urinalysis    Component Value Date/Time   COLORURINE YELLOW (A) 07/23/2016 0415   APPEARANCEUR CLEAR (A) 07/23/2016 0415   APPEARANCEUR Clear 11/09/2014 0849   LABSPEC 1.028 07/23/2016 0415   LABSPEC 1.026 11/09/2014 0849   PHURINE 5.0 07/23/2016 0415   GLUCOSEU NEGATIVE 07/23/2016 0415   GLUCOSEU Negative 11/09/2014 0849   HGBUR SMALL (A) 07/23/2016 0415   BILIRUBINUR NEGATIVE 07/23/2016 0415   BILIRUBINUR Negative 11/09/2014 0849   KETONESUR 5 (A) 07/23/2016 0415   PROTEINUR 30 (A) 07/23/2016 0415   NITRITE NEGATIVE 07/23/2016 0415   LEUKOCYTESUR MODERATE (A) 07/23/2016 0415   LEUKOCYTESUR Negative 11/09/2014 0849    Pertinent Imaging: CLINICAL DATA:  Left lower quadrant pain beginning at midnight. Diagnosed with a kidney stone last month.  EXAM: ABDOMEN - 1 VIEW  COMPARISON:  CT abdomen and pelvis 06/22/2016.  Abdomen 05/06/2016.  FINDINGS: 5 mm stone demonstrated in the left pelvis likely corresponding to the stones seen in the distal left ureter on prior CT. No additional renal or ureteral stones identified. Gas and stool throughout  the colon. No small or large bowel distention. Visualized bones appear intact.  IMPRESSION: Calculus in the left pelvis likely representing a distal left ureteral stone. Similar position to previous CT.   Electronically Signed   By: Burman Nieves M.D.   On: 07/23/2016 05:46  KUB was personally reviewed today along with previous CT scan from 06/22/2016.  Assessment & Plan:    1. Left ureteral stone 5 mm retained left distal ureteral stone  Given that the stone has failed to pass since October 2017, I would not recommend any further medical expulsive therapy.  We discussed various treatment options including ESWL vs. ureteroscopy, laser lithotripsy, and stent. We discussed the risks and benefits of both including bleeding, infection, damage to surrounding structures, efficacy with need for possible further intervention, and need for temporary ureteral stent.  Given his history of impacted stone, I would recommend ureteroscopy.  He is agreeable with this plan.  All questions were answered.  Preoperative culture today.   2. Acute kidney injury (HCC) Secondary to likely dehydration and unilateral obstruction  3. Bloody emesis  This has stopped. I suspect this likely was secondary to a Mallory-Weiss tear. Patient was advised to present back to the emergency room if it happens again. If he continues after the stone episode, he should seek further workup with endoscopy/GI evaluation.  Vanna ScotlandAshley Halston Fairclough, MD  [facility] [address], [address] [place], [place] [address] [phone]

## 2016-07-28 NOTE — Patient Instructions (Signed)
g

## 2016-07-31 ENCOUNTER — Telehealth: Payer: Self-pay | Admitting: Radiology

## 2016-07-31 ENCOUNTER — Other Ambulatory Visit: Payer: Self-pay | Admitting: Radiology

## 2016-07-31 ENCOUNTER — Encounter
Admission: RE | Admit: 2016-07-31 | Discharge: 2016-07-31 | Disposition: A | Discharge status: Home / Self Care | Payer: Self-pay | Source: Ambulatory Visit | Attending: Urology | Admitting: Urology

## 2016-07-31 DIAGNOSIS — N201 Calculus of ureter: Secondary | ICD-10-CM

## 2016-07-31 HISTORY — DX: Headache: R51

## 2016-07-31 HISTORY — DX: Personal history of urinary calculi: Z87.442

## 2016-07-31 HISTORY — DX: Family history of other specified conditions: Z84.89

## 2016-07-31 HISTORY — DX: Anxiety disorder, unspecified: F41.9

## 2016-07-31 HISTORY — DX: Headache, unspecified: R51.9

## 2016-07-31 LAB — CULTURE, URINE COMPREHENSIVE

## 2016-07-31 NOTE — Patient Instructions (Signed)
  Your procedure is scheduled on: 08-03-16 Report to Same Day Surgery 2nd floor medical mall [facility](Medical Mall Entrance-take elevator on left to 2nd floor.  Check in with surgery information desk.) To find out your arrival time please call [phone] between 1PM - 3PM on 07-31-16  Remember: Instructions that are not followed completely may result in serious medical risk, up to and including death, or upon the discretion of your surgeon and anesthesiologist your surgery may need to be rescheduled.    _x___ 1. Do not eat food or drink liquids after midnight. No gum chewing or hard candies.     __x__ 2. No Alcohol for 24 hours before or after surgery.   __x__3. No Smoking for 24 prior to surgery.   ____  4. Bring all medications with you on the day of surgery if instructed.    __x__ 5. Notify your doctor if there is any change in your medical condition     (cold, fever, infections).     Do not wear jewelry, make-up, hairpins, clips or nail polish.  Do not wear lotions, powders, or perfumes. You may wear deodorant.  Do not shave 48 hours prior to surgery. Men may shave face and neck.  Do not bring valuables to the hospital.    [facility] is not responsible for any belongings or valuables.               Contacts, dentures or bridgework may not be worn into surgery.  Leave your suitcase in the car. After surgery it may be brought to your room.  For patients admitted to the hospital, discharge time is determined by your treatment team.   Patients discharged the day of surgery will not be allowed to drive home.  You will need someone to drive you home and stay with you the night of your procedure.    Please read over the following fact sheets that you were given:   [facility] Preparing for Surgery and or MRSA Information   _x___ Take these medicines the morning of surgery with A SIP OF WATER:    1. FLOMAX  2. MAY TAKE PERCOCET IF NEEDED AM OF SURGERY  3.  4.  5.  6.  ____Fleets enema  or Magnesium Citrate as directed.   ____ Use CHG Soap or sage wipes as directed on instruction sheet   ____ Use inhalers on the day of surgery and bring to hospital day of surgery  ____ Stop metformin 2 days prior to surgery    ____ Take 1/2 of usual insulin dose the night before surgery and none on the morning of           surgery.   ____ Stop Aspirin, Coumadin, Pllavix ,Eliquis, Effient, or Pradaxa  x__ Stop Anti-inflammatories such as Advil, Aleve, Ibuprofen, Motrin, Naproxen,          Naprosyn, Goodies powders or aspirin products NOW- Ok to take Tylenol.   ____ Stop supplements until after surgery.    ____ Bring C-Pap to the hospital.

## 2016-07-31 NOTE — Telephone Encounter (Signed)
Notified pt of surgery scheduled with Dr Apolinar JunesBrandon on 08/03/16, pre-admit phone interview on 07/31/16 between 9am-1pm & to call Friday prior to surgery for arrival time to [facility]. Pt voices understanding.

## 2016-08-03 ENCOUNTER — Ambulatory Visit: Payer: Self-pay | Admitting: Anesthesiology

## 2016-08-03 ENCOUNTER — Ambulatory Visit
Admission: RE | Admit: 2016-08-03 | Discharge: 2016-08-03 | Disposition: A | Discharge status: Home / Self Care | Payer: Self-pay | Source: Ambulatory Visit | Attending: Urology | Admitting: Urology

## 2016-08-03 ENCOUNTER — Encounter
Admission: RE | Disposition: A | Discharge status: Home / Self Care | Payer: Self-pay | Source: Ambulatory Visit | Attending: Urology

## 2016-08-03 ENCOUNTER — Encounter: Payer: Self-pay | Admitting: *Deleted

## 2016-08-03 DIAGNOSIS — N201 Calculus of ureter: Secondary | ICD-10-CM | POA: Insufficient documentation

## 2016-08-03 DIAGNOSIS — N179 Acute kidney failure, unspecified: Secondary | ICD-10-CM | POA: Insufficient documentation

## 2016-08-03 DIAGNOSIS — Z79899 Other long term (current) drug therapy: Secondary | ICD-10-CM | POA: Insufficient documentation

## 2016-08-03 DIAGNOSIS — Z87442 Personal history of urinary calculi: Secondary | ICD-10-CM | POA: Insufficient documentation

## 2016-08-03 DIAGNOSIS — F17210 Nicotine dependence, cigarettes, uncomplicated: Secondary | ICD-10-CM | POA: Insufficient documentation

## 2016-08-03 HISTORY — PX: CYSTOSCOPY WITH STENT PLACEMENT: SHX5790

## 2016-08-03 HISTORY — PX: URETEROSCOPY WITH HOLMIUM LASER LITHOTRIPSY: SHX6645

## 2016-08-03 SURGERY — URETEROSCOPY, WITH LITHOTRIPSY USING HOLMIUM LASER
Anesthesia: General | Laterality: Left | Wound class: Clean Contaminated

## 2016-08-03 MED ORDER — ONDANSETRON HCL 4 MG/2ML IJ SOLN
INTRAMUSCULAR | Status: DC | PRN
  Administered 2016-08-03: 4 mg via INTRAVENOUS

## 2016-08-03 MED ORDER — OXYCODONE-ACETAMINOPHEN 10-325 MG PO TABS: 1.0000 | ORAL_TABLET | Freq: Four times a day (QID) | ORAL | 0 refills | Status: DC | PRN

## 2016-08-03 MED ORDER — SUCCINYLCHOLINE CHLORIDE 200 MG/10ML IV SOSY
PREFILLED_SYRINGE | INTRAVENOUS | Status: AC
  Filled 2016-08-03: qty 10

## 2016-08-03 MED ORDER — PROMETHAZINE HCL 25 MG/ML IJ SOLN: 6.2500 mg | INTRAMUSCULAR | Status: DC | PRN

## 2016-08-03 MED ORDER — IOTHALAMATE MEGLUMINE 43 % IV SOLN
INTRAVENOUS | Status: DC | PRN
  Administered 2016-08-03: 20 mL

## 2016-08-03 MED ORDER — LACTATED RINGERS IV SOLN
INTRAVENOUS | Status: DC
  Administered 2016-08-03: 13:00:00 via INTRAVENOUS

## 2016-08-03 MED ORDER — PROPOFOL 10 MG/ML IV BOLUS
INTRAVENOUS | Status: AC
  Filled 2016-08-03: qty 20

## 2016-08-03 MED ORDER — FAMOTIDINE 20 MG PO TABS
ORAL_TABLET | ORAL | Status: AC
  Filled 2016-08-03: qty 1

## 2016-08-03 MED ORDER — PROPOFOL 10 MG/ML IV BOLUS
INTRAVENOUS | Status: DC | PRN
  Administered 2016-08-03 (×2): 25 mg via INTRAVENOUS
  Administered 2016-08-03: 150 mg via INTRAVENOUS

## 2016-08-03 MED ORDER — FENTANYL CITRATE (PF) 100 MCG/2ML IJ SOLN
INTRAMUSCULAR | Status: AC
  Administered 2016-08-03: 25 ug via INTRAVENOUS
  Filled 2016-08-03: qty 2

## 2016-08-03 MED ORDER — DEXAMETHASONE SODIUM PHOSPHATE 10 MG/ML IJ SOLN
INTRAMUSCULAR | Status: AC
Start: 2016-08-03 — End: 2016-08-03
  Filled 2016-08-03: qty 1

## 2016-08-03 MED ORDER — BELLADONNA ALKALOIDS-OPIUM 16.2-60 MG RE SUPP
1.0000 | Freq: Once | RECTAL | Status: AC
  Administered 2016-08-03: 1 via RECTAL

## 2016-08-03 MED ORDER — FENTANYL CITRATE (PF) 100 MCG/2ML IJ SOLN
INTRAMUSCULAR | Status: AC
  Filled 2016-08-03: qty 2

## 2016-08-03 MED ORDER — ONDANSETRON HCL 4 MG/2ML IJ SOLN
INTRAMUSCULAR | Status: AC
  Filled 2016-08-03: qty 2

## 2016-08-03 MED ORDER — LIDOCAINE 2% (20 MG/ML) 5 ML SYRINGE
INTRAMUSCULAR | Status: AC
  Filled 2016-08-03: qty 5

## 2016-08-03 MED ORDER — FENTANYL CITRATE (PF) 100 MCG/2ML IJ SOLN
INTRAMUSCULAR | Status: DC | PRN
  Administered 2016-08-03 (×3): 50 ug via INTRAVENOUS

## 2016-08-03 MED ORDER — CEFAZOLIN SODIUM-DEXTROSE 2-4 GM/100ML-% IV SOLN
INTRAVENOUS | Status: AC
  Administered 2016-08-03: 2000 mg
  Filled 2016-08-03: qty 100

## 2016-08-03 MED ORDER — CEFAZOLIN SODIUM-DEXTROSE 2-4 GM/100ML-% IV SOLN: 2.0000 g | INTRAVENOUS | Status: DC

## 2016-08-03 MED ORDER — BELLADONNA ALKALOIDS-OPIUM 16.2-60 MG RE SUPP
RECTAL | Status: AC
  Filled 2016-08-03: qty 1

## 2016-08-03 MED ORDER — MIDAZOLAM HCL 2 MG/2ML IJ SOLN
INTRAMUSCULAR | Status: AC
  Filled 2016-08-03: qty 2

## 2016-08-03 MED ORDER — FAMOTIDINE 20 MG PO TABS
20.0000 mg | ORAL_TABLET | Freq: Once | ORAL | Status: AC
  Administered 2016-08-03: 20 mg via ORAL

## 2016-08-03 MED ORDER — OXYBUTYNIN CHLORIDE 5 MG PO TABS: 5.0000 mg | ORAL_TABLET | Freq: Three times a day (TID) | ORAL | 0 refills | Status: DC | PRN

## 2016-08-03 MED ORDER — DEXAMETHASONE SODIUM PHOSPHATE 10 MG/ML IJ SOLN
INTRAMUSCULAR | Status: DC | PRN
Start: 2016-08-03 — End: 2016-08-03
  Administered 2016-08-03: 5 mg via INTRAVENOUS

## 2016-08-03 MED ORDER — LIDOCAINE HCL (CARDIAC) 20 MG/ML IV SOLN
INTRAVENOUS | Status: DC | PRN
  Administered 2016-08-03: 80 mg via INTRAVENOUS

## 2016-08-03 MED ORDER — TAMSULOSIN HCL 0.4 MG PO CAPS: 0.4000 mg | ORAL_CAPSULE | Freq: Every day | ORAL | 0 refills | Status: DC

## 2016-08-03 MED ORDER — FENTANYL CITRATE (PF) 100 MCG/2ML IJ SOLN
25.0000 ug | INTRAMUSCULAR | Status: DC | PRN
  Administered 2016-08-03 (×4): 25 ug via INTRAVENOUS

## 2016-08-03 MED ORDER — MIDAZOLAM HCL 2 MG/2ML IJ SOLN
INTRAMUSCULAR | Status: DC | PRN
  Administered 2016-08-03: 2 mg via INTRAVENOUS

## 2016-08-03 MED ORDER — DOCUSATE SODIUM 100 MG PO CAPS: 100.0000 mg | ORAL_CAPSULE | Freq: Two times a day (BID) | ORAL | 0 refills | Status: DC

## 2016-08-03 SURGICAL SUPPLY — 33 items
BAG DRAIN CYSTO-URO LG1000N (MISCELLANEOUS) ×3 IMPLANT
BASKET ZERO TIP 1.9FR (BASKET) ×3 IMPLANT
CATH ROBINSON RED A/P 16FR (CATHETERS) ×3 IMPLANT
CATH URETL 5X70 OPEN END (CATHETERS) ×3 IMPLANT
CNTNR SPEC 2.5X3XGRAD LEK (MISCELLANEOUS) ×1
CONRAY 43 FOR UROLOGY 50M (MISCELLANEOUS) ×3 IMPLANT
CONT SPEC 4OZ STER OR WHT (MISCELLANEOUS) ×2
CONTAINER SPEC 2.5X3XGRAD LEK (MISCELLANEOUS) ×1 IMPLANT
DRAPE UTILITY 15X26 TOWEL STRL (DRAPES) ×3 IMPLANT
FIBER LASER LITHO 273 (Laser) ×3 IMPLANT
GLOVE BIO SURGEON STRL SZ 6.5 (GLOVE) ×4 IMPLANT
GLOVE BIO SURGEONS STRL SZ 6.5 (GLOVE) ×2
GOWN STRL REUS W/ TWL LRG LVL3 (GOWN DISPOSABLE) ×2 IMPLANT
GOWN STRL REUS W/ TWL LRG LVL4 (GOWN DISPOSABLE) ×2 IMPLANT
GOWN STRL REUS W/TWL LRG LVL3 (GOWN DISPOSABLE) ×4
GOWN STRL REUS W/TWL LRG LVL4 (GOWN DISPOSABLE) ×4
GUIDEWIRE GREEN .038 145CM (MISCELLANEOUS) ×3 IMPLANT
INFUSOR MANOMETER BAG 3000ML (MISCELLANEOUS) ×3 IMPLANT
INTRODUCER DILATOR DOUBLE (INTRODUCER) IMPLANT
KIT RM TURNOVER CYSTO AR (KITS) ×3 IMPLANT
PACK CYSTO AR (MISCELLANEOUS) ×3 IMPLANT
SCRUB POVIDONE IODINE 4 OZ (MISCELLANEOUS) IMPLANT
SENSORWIRE 0.038 NOT ANGLED (WIRE) ×3
SET CYSTO W/LG BORE CLAMP LF (SET/KITS/TRAYS/PACK) ×3 IMPLANT
SHEATH URETERAL 12FRX35CM (MISCELLANEOUS) IMPLANT
SOL .9 NS 3000ML IRR  AL (IV SOLUTION) ×2
SOL .9 NS 3000ML IRR UROMATIC (IV SOLUTION) ×1 IMPLANT
STENT URET 6FRX24 CONTOUR (STENTS) IMPLANT
STENT URET 6FRX26 CONTOUR (STENTS) ×3 IMPLANT
SURGILUBE 2OZ TUBE FLIPTOP (MISCELLANEOUS) ×3 IMPLANT
SYRINGE IRR TOOMEY STRL 70CC (SYRINGE) ×3 IMPLANT
WATER STERILE IRR 1000ML POUR (IV SOLUTION) ×3 IMPLANT
WIRE SENSOR 0.038 NOT ANGLED (WIRE) ×1 IMPLANT

## 2016-08-03 NOTE — Anesthesia Postprocedure Evaluation (Signed)
Anesthesia Post Note  Patient: Lance Garcia  Procedure(s) Performed: Procedure(s) (LRB): URETEROSCOPY WITH HOLMIUM LASER LITHOTRIPSY (Left) CYSTOSCOPY WITH STENT PLACEMENT (Left)  Patient location during evaluation: PACU Anesthesia Type: General Level of consciousness: awake and alert Pain management: pain level controlled Vital Signs Assessment: post-procedure vital signs reviewed and stable Respiratory status: spontaneous breathing, nonlabored ventilation, respiratory function stable and patient connected to nasal cannula oxygen Cardiovascular status: blood pressure returned to baseline and stable Postop Assessment: no signs of nausea or vomiting Anesthetic complications: no     Last Vitals:  Vitals:   08/03/16 1609 08/03/16 1614  BP:    Pulse: 74 76  Resp: 18 16  Temp:  36.6 C    Last Pain:  Vitals:   08/03/16 1614  TempSrc:   PainSc: 2                  Cleda MccreedyJoseph K Piscitello

## 2016-08-03 NOTE — Progress Notes (Signed)
States mostly pressure  Has voided

## 2016-08-03 NOTE — Progress Notes (Signed)
Voided x2 

## 2016-08-03 NOTE — Op Note (Signed)
Date of procedure: 08/03/16  Preoperative diagnosis:  1. Left distal ureteral stone   Postoperative diagnosis:  1. Left distal ureteral stone, impacted   Procedure: 1. Cystoscopy 2. Left ureteroscopy 3. Laser lithotripsy 4. Basket extraction of Stone fragment 5. Left retrograde pyelogram  Surgeon: Vanna Scotland, MD  Anesthesia: General  Complications: None  Intraoperative findings: Severely impacted left distal ureteral stone with area of narrowing nearly 1 cm and retrograde pyelogram, proximal ureteral dilation, no extravasation.  EBL: Normal  Specimens: Stone fragment  Drains: 6 x 26 French double-J ureteral stent on left  Indication: Lance Garcia is a 31 y.o. patient with retained left distal ureteral stone and history of left ureteral stone impaction the remote past..  After reviewing the management options for treatment, he elected to proceed with the above surgical procedure(s). We have discussed the potential benefits and risks of the procedure, side effects of the proposed treatment, the likelihood of the patient achieving the goals of the procedure, and any potential problems that might occur during the procedure or recuperation. Informed consent has been obtained.  Description of procedure:  The patient was taken to the operating room and general anesthesia was induced.  The patient was placed in the dorsal lithotomy position, prepped and draped in the usual sterile fashion, and preoperative antibiotics were administered. A preoperative time-out was performed.   A 21 French scope was advanced per urethra into the bladder. Attention was turned to the left ureteral orifice which was cannulated using a open-ended ureteral catheter. A gentle retrograde pyelogram was performed which showed a decompressed left distal ureter and no contrast just a few centimeters within the ureter at the site of a filling defect at the known stone location. There was no contrast beyond  this. This point in time, a sensor wire was then able to be advanced all the way up to level of the kidney without difficulty beyond the stone. This was snapped in place as a safety wire. A 4.5 French semirigid scope was then brought in and advanced up to the level of the stone. Visual and maneuvering around the stone was somewhat difficult and this scope was introduced using a railroad technique.  There was mucosa partially covering the stone which was embedded into the anterior and lateral wall of the ureter. The remaining surrounding mucosa was ragged and whitish in appearance which was consistent with scar like fibrosis. This point in time, a 270  laser for was then brought in and the stone was carefully fragmented. The portions embedded into the wall were gently dislodged using the beak of the scope. Ultimately, I was able to carefully and methodically clear the distal ureter of all stone fragment.  This was accomplished by stone fragmentation as well as basket extraction of stone using a to this 1.9 [place] nitinol basket. A repeat retrograde pyelogram showed an approximately centimeter long strictured area at the site of the previous stone despite no residual stone fragment. At this point, the ureter proximal to the previous stone did fill it appeared dilated. There was no extravasation. The safety wire was then backloaded over a rigid cystoscope. A 6 x 26 French double-J ureteral stent was then advanced over the wire up to level of the kidney. The wire was partially drawn until full coil was noted within the renal pelvis. The wire was then fully withdrawn and a full coil was noted in the bladder. The bladder was then drained. The scope was then removed.  The patient was then  repositioned in the supine position, reversed from anesthesia, and taken to the PACU in stable condition.  Plan: Given the concern for development of ureteral stricture or possibly even previous stricture at the site of stone  impaction, the stent will remain in place for at least 2 weeks. Thereafter, close and careful follow-up was stressed to rule out stricture.  Vanna ScotlandAshley Adri Schloss, M.D.

## 2016-08-03 NOTE — Anesthesia Post-op Follow-up Note (Cosign Needed)
Anesthesia QCDR form completed.        

## 2016-08-03 NOTE — Transfer of Care (Signed)
Immediate Anesthesia Transfer of Care Note  Patient: Lance Garcia  Procedure(s) Performed: Procedure(s): URETEROSCOPY WITH HOLMIUM LASER LITHOTRIPSY (Left) CYSTOSCOPY WITH STENT PLACEMENT (Left)  Patient Location: PACU  Anesthesia Type:General  Level of Consciousness: sedated  Airway & Oxygen Therapy: Patient Spontanous Breathing and Patient connected to face mask oxygen  Post-op Assessment: Report given to RN and Post -op Vital signs reviewed and stable  Post vital signs: Reviewed  Last Vitals:  Vitals:   08/03/16 1222 08/03/16 1522  BP: 128/72 119/64  Pulse: 73 68  Resp: 16 11  Temp: 36.5 C 36.1 C    Last Pain:  Vitals:   08/03/16 1222  TempSrc: Oral  PainSc: 2          Complications: No apparent anesthesia complications

## 2016-08-03 NOTE — Discharge Instructions (Addendum)
You have a ureteral stent in place.  This is a tube that extends from your kidney to your bladder.  This may cause urinary bleeding, burning with urination, and urinary frequency.  Please call our office or present to the ED if you develop fevers >101 or pain which is not able to be controlled with oral pain medications.  You may be given either Flomax and/ or ditropan to help with bladder spasms and stent pain in addition to pain medications.   ° °[facility] °[address], [address] °[place], [place] [address] °[phone] °

## 2016-08-03 NOTE — Anesthesia Procedure Notes (Signed)
Procedure Name: LMA Insertion Date/Time: 08/03/2016 2:06 PM Performed by: Irving BurtonBACHICH, Sherine Cortese Pre-anesthesia Checklist: Patient identified, Emergency Drugs available, Suction available and Patient being monitored Patient Re-evaluated:Patient Re-evaluated prior to inductionOxygen Delivery Method: Circle system utilized Preoxygenation: Pre-oxygenation with 100% oxygen Intubation Type: IV induction Ventilation: Mask ventilation without difficulty LMA: LMA inserted LMA Size: 4.0 Number of attempts: 1 Placement Confirmation: positive ETCO2 and breath sounds checked- equal and bilateral Tube secured with: Tape Dental Injury: Teeth and Oropharynx as per pre-operative assessment

## 2016-08-03 NOTE — Anesthesia Preprocedure Evaluation (Signed)
Anesthesia Evaluation  Patient identified by MRN, date of birth, ID band Patient awake    Reviewed: Allergy & Precautions, H&P , NPO status , Patient's Chart, lab work & pertinent test results, reviewed documented beta blocker date and time   History of Anesthesia Complications (+) Family history of anesthesia reaction and history of anesthetic complications  Airway Mallampati: I  TM Distance: >3 FB Neck ROM: full    Dental  (+) Missing, Chipped, Dental Advidsory Given   Pulmonary neg shortness of breath, neg recent URI, Current Smoker,    Pulmonary exam normal breath sounds clear to auscultation       Cardiovascular Exercise Tolerance: Good negative cardio ROS Normal cardiovascular exam Rhythm:regular Rate:Normal     Neuro/Psych  Headaches, neg Seizures negative psych ROS   GI/Hepatic Neg liver ROS, GERD  ,  Endo/Other  negative endocrine ROS  Renal/GU Renal disease (kidney stones)  negative genitourinary   Musculoskeletal   Abdominal   Peds  Hematology negative hematology ROS (+)   Anesthesia Other Findings Past Medical History: No date: Anxiety No date: Family history of adverse reaction to anesthes*     Comment: PTS MOM-N/V No date: Headache     Comment: h/o migraines No date: History of kidney stones   Reproductive/Obstetrics negative OB ROS                             Anesthesia Physical Anesthesia Plan  ASA: II  Anesthesia Plan: General   Post-op Pain Management:    Induction:   Airway Management Planned:   Additional Equipment:   Intra-op Plan:   Post-operative Plan:   Informed Consent: I have reviewed the patients History and Physical, chart, labs and discussed the procedure including the risks, benefits and alternatives for the proposed anesthesia with the patient or authorized representative who has indicated his/her understanding and acceptance.   Dental  Advisory Given  Plan Discussed with: Anesthesiologist, CRNA and Surgeon  Anesthesia Plan Comments:         Anesthesia Quick Evaluation

## 2016-08-03 NOTE — Interval H&P Note (Signed)
History and Physical Interval Note:  08/03/2016 1:33 PM  Lance BattiestJoshua W Eke  has presented today for surgery, with the diagnosis of LEFT URETERAL STONE  The various methods of treatment have been discussed with the patient and family. After consideration of risks, benefits and other options for treatment, the patient has consented to  Procedure(s): URETEROSCOPY WITH HOLMIUM LASER LITHOTRIPSY (Left) CYSTOSCOPY WITH STENT PLACEMENT (Left) as a surgical intervention .  The patient's history has been reviewed, patient examined, no change in status, stable for surgery.  I have reviewed the patient's chart and labs.  Questions were answered to the patient's satisfaction.     Vanna ScotlandAshley Jivan Symanski

## 2016-08-03 NOTE — H&P (View-Only) (Signed)
07/28/2016 3:18 PM   Lance Garcia [birth year] [id number]  Referring provider: No referring provider defined for this encounter.  Chief Complaint  Patient presents with  . Nephrolithiasis    New Patient    HPI: 31 year old male who presents the office today to discuss management for his 5 mm left distal ureteral stone. He was seen in the emergency room on 05/06/2016 and again 2 additional times on 06/22/2016 and 07/23/2016 after the stone failed to pass.  Initially on presentation, he did have a fairly significant leukocytosis which has normalized down to 1.9 from 22.2 a little over a month ago.  He's also noted to have some mild acute kidney injury, creatinine elevated to 1.24 from baseline of 0.9.  Urinalysis only showed evidence of microscopic hematuria, otherwise unremarkable.  He continues to have considerable flank pain/ left lower quadrant pain. He is avoiding narcotics as possible as he cannot take them while working. He has had nausea and vomiting and did see blood in his emesis around the time of his ER visit. This seems to be improving. No fevers or chills.  No dysuria or gross hematuria.  He does have personal history of stones in 2005 requiring ureteroscopy by Dr. Lonna Cobb.  He was told that it had imbedded into the wall of the ureter.  I was on the left side.  He did also spontaneously past a small left sided stone 3 years ago.     PMH: Past Medical History:  Diagnosis Date  . GERD (gastroesophageal reflux disease)   . Kidney stones   . Renal disorder     Surgical History: Past Surgical History:  Procedure Laterality Date  . KIDNEY STONE SURGERY      Home Medications:  Allergies as of 07/28/2016   No Known Allergies     Medication List       Accurate as of 07/28/16 11:59 PM. Always use your most recent med list.          ciprofloxacin 500 MG tablet Commonly known as:  CIPRO Take 1 tablet (500 mg total) by mouth 2 (two) times daily.   ondansetron  4 MG disintegrating tablet Commonly known as:  ZOFRAN ODT Take 1 tablet (4 mg total) by mouth every 8 (eight) hours as needed for nausea or vomiting.   oxyCODONE-acetaminophen 10-325 MG tablet Commonly known as:  PERCOCET Take 1 tablet by mouth every 6 (six) hours as needed for pain.   tamsulosin 0.4 MG Caps capsule Commonly known as:  FLOMAX Take 1 capsule (0.4 mg total) by mouth daily.       Allergies: No Known Allergies  Family History: No family history on file.  Social History:  reports that he has been smoking Cigarettes.  He has been smoking about 1.00 pack per day. He has never used smokeless tobacco. He reports that he does not drink alcohol. His drug history is not on file.  ROS: UROLOGY Frequent Urination?: No Hard to postpone urination?: Yes Burning/pain with urination?: Yes Get up at night to urinate?: Yes Leakage of urine?: No Urine stream starts and stops?: Yes Trouble starting stream?: No Do you have to strain to urinate?: No Blood in urine?: Yes Urinary tract infection?: Yes Sexually transmitted disease?: No Injury to kidneys or bladder?: No Painful intercourse?: No Weak stream?: Yes Erection problems?: No Penile pain?: No  Gastrointestinal Nausea?: Yes Vomiting?: Yes Indigestion/heartburn?: Yes Diarrhea?: No Constipation?: No  Constitutional Fever: Yes Night sweats?: Yes Weight loss?: Yes Fatigue?: Yes  Skin  Skin rash/lesions?: No Itching?: No  Eyes Blurred vision?: No Double vision?: No  Ears/Nose/Throat Sore throat?: No Sinus problems?: No  Hematologic/Lymphatic Swollen glands?: No Easy bruising?: No  Cardiovascular Leg swelling?: No Chest pain?: No  Respiratory Cough?: No Shortness of breath?: Yes  Endocrine Excessive thirst?: Yes  Musculoskeletal Back pain?: Yes Joint pain?: No  Neurological Headaches?: Yes Dizziness?: No  Psychologic Depression?: No Anxiety?: No  Physical Exam: BP 138/85   Pulse 77    Ht 5\' 9"  (1.753 m)   Wt 146 lb (66.2 kg)   BMI 21.56 kg/m   Constitutional:  Alert and oriented, No acute distress.  Accompanied by fianc. HEENT: [place] AT, moist mucus membranes.  Trachea midline, no masses. Cardiovascular: No clubbing, cyanosis, or edema. RRR. Respiratory: Normal respiratory effort, no increased work of breathing. CTAB. GI: Abdomen is soft, nontender, nondistended, no abdominal masses GU: Mild left CVA tenderness.   Skin: No rashes, bruises or suspicious lesions. Neurologic: Grossly intact, no focal deficits, moving all 4 extremities. Psychiatric: Normal mood and affect.  Laboratory Data: Lab Results  Component Value Date   WBC 11.9 (H) 07/23/2016   HGB 14.3 07/23/2016   HCT 41.6 07/23/2016   MCV 85.5 07/23/2016   PLT 246 07/23/2016    Lab Results  Component Value Date   CREATININE 1.24 07/23/2016   Urinalysis    Component Value Date/Time   COLORURINE YELLOW (A) 07/23/2016 0415   APPEARANCEUR CLEAR (A) 07/23/2016 0415   APPEARANCEUR Clear 11/09/2014 0849   LABSPEC 1.028 07/23/2016 0415   LABSPEC 1.026 11/09/2014 0849   PHURINE 5.0 07/23/2016 0415   GLUCOSEU NEGATIVE 07/23/2016 0415   GLUCOSEU Negative 11/09/2014 0849   HGBUR SMALL (A) 07/23/2016 0415   BILIRUBINUR NEGATIVE 07/23/2016 0415   BILIRUBINUR Negative 11/09/2014 0849   KETONESUR 5 (A) 07/23/2016 0415   PROTEINUR 30 (A) 07/23/2016 0415   NITRITE NEGATIVE 07/23/2016 0415   LEUKOCYTESUR MODERATE (A) 07/23/2016 0415   LEUKOCYTESUR Negative 11/09/2014 0849    Pertinent Imaging: CLINICAL DATA:  Left lower quadrant pain beginning at midnight. Diagnosed with a kidney stone last month.  EXAM: ABDOMEN - 1 VIEW  COMPARISON:  CT abdomen and pelvis 06/22/2016.  Abdomen 05/06/2016.  FINDINGS: 5 mm stone demonstrated in the left pelvis likely corresponding to the stones seen in the distal left ureter on prior CT. No additional renal or ureteral stones identified. Gas and stool throughout  the colon. No small or large bowel distention. Visualized bones appear intact.  IMPRESSION: Calculus in the left pelvis likely representing a distal left ureteral stone. Similar position to previous CT.   Electronically Signed   By: Burman Nieves M.D.   On: 07/23/2016 05:46  KUB was personally reviewed today along with previous CT scan from 06/22/2016.  Assessment & Plan:    1. Left ureteral stone 5 mm retained left distal ureteral stone  Given that the stone has failed to pass since October 2017, I would not recommend any further medical expulsive therapy.  We discussed various treatment options including ESWL vs. ureteroscopy, laser lithotripsy, and stent. We discussed the risks and benefits of both including bleeding, infection, damage to surrounding structures, efficacy with need for possible further intervention, and need for temporary ureteral stent.  Given his history of impacted stone, I would recommend ureteroscopy.  He is agreeable with this plan.  All questions were answered.  Preoperative culture today.   2. Acute kidney injury (HCC) Secondary to likely dehydration and unilateral obstruction  3. Bloody emesis  This has stopped. I suspect this likely was secondary to a Mallory-Weiss tear. Patient was advised to present back to the emergency room if it happens again. If he continues after the stone episode, he should seek further workup with endoscopy/GI evaluation.  Vanna ScotlandAshley Tashauna Caisse, MD  [facility] [address], [address] [place], [place] [id number] [phone]

## 2016-08-03 NOTE — Progress Notes (Signed)
No dressing 

## 2016-08-04 ENCOUNTER — Encounter: Payer: Self-pay | Admitting: Urology

## 2016-08-10 LAB — STONE ANALYSIS
Ca Oxalate,Dihydrate: 25 %
Ca Oxalate,Monohydr.: 70 %
Ca phos cry stone ql IR: 5 %
Stone Weight KSTONE: 2.6 mg

## 2016-08-20 ENCOUNTER — Ambulatory Visit (INDEPENDENT_AMBULATORY_CARE_PROVIDER_SITE_OTHER): Payer: Self-pay | Admitting: Urology

## 2016-08-20 ENCOUNTER — Encounter: Payer: Self-pay | Admitting: Urology

## 2016-08-20 VITALS — BP 113/70 | HR 71 | Ht 69.0 in | Wt 145.8 lb

## 2016-08-20 DIAGNOSIS — N200 Calculus of kidney: Secondary | ICD-10-CM

## 2016-08-20 LAB — URINALYSIS, COMPLETE
BILIRUBIN UA: NEGATIVE
Glucose, UA: NEGATIVE
Ketones, UA: NEGATIVE
NITRITE UA: NEGATIVE
PH UA: 5.5 (ref 5.0–7.5)
Specific Gravity, UA: >1.03 — ABNORMAL HIGH (ref 1.005–1.030)
UUROB: 1 mg/dL (ref 0.2–1.0)

## 2016-08-20 LAB — MICROSCOPIC EXAMINATION
BACTERIA UA: NONE SEEN
EPITHELIAL CELLS (NON RENAL): NONE SEEN /HPF (ref 0–10)
WBC UA: NONE SEEN /HPF (ref 0–?)

## 2016-08-20 NOTE — Progress Notes (Signed)
   08/20/16  CC:  Chief Complaint  Patient presents with  . Cysto Stent Removal    HPI: 31 y.o. patient with retained left distal ureteral stone and history of left ureteral stone impaction the remote past. Stone was found to be impacted in left ureteral wall at time of surgery. He presents today for stent removal.   There were no vitals taken for this visit. NED. A&Ox3.   No respiratory distress   Abd soft, NT, ND Normal phallus with bilateral descended testicles  Cystoscopy Procedure Note  Patient identification was confirmed, informed consent was obtained, and patient was prepped using Betadine solution.  Lidocaine jelly was administered per urethral meatus.    Preoperative abx where received prior to procedure.     Pre-Procedure: - Inspection reveals a normal caliber ureteral meatus.  Procedure: The flexible cystoscope was introduced without difficulty - No urethral strictures/lesions are present. - Left ureteral stent was removed intact with flexible graspers   Post-Procedure: - Patient tolerated the procedure well  Assessment/ Plan:  1. Left ureteral stone -follow up in one month with renal ultrasound. Will need to keep a close watch out for left hydronephrosis due history of severely impacted left ureteral stone with subsequent risk for left ureteral structure -will go over stone analysis when available

## 2016-09-16 ENCOUNTER — Ambulatory Visit
Admission: RE | Admit: 2016-09-16 | Discharge: 2016-09-16 | Disposition: A | Discharge status: Home / Self Care | Payer: Self-pay | Source: Ambulatory Visit | Attending: Urology | Admitting: Urology

## 2016-09-16 ENCOUNTER — Ambulatory Visit: Payer: Self-pay

## 2016-09-16 DIAGNOSIS — Z87442 Personal history of urinary calculi: Secondary | ICD-10-CM | POA: Insufficient documentation

## 2016-09-16 DIAGNOSIS — N200 Calculus of kidney: Secondary | ICD-10-CM

## 2016-09-18 ENCOUNTER — Encounter: Payer: Self-pay | Admitting: Urology

## 2016-09-18 ENCOUNTER — Ambulatory Visit (INDEPENDENT_AMBULATORY_CARE_PROVIDER_SITE_OTHER): Payer: Self-pay | Admitting: Urology

## 2016-09-18 VITALS — BP 123/74 | HR 65 | Ht 68.0 in | Wt 146.8 lb

## 2016-09-18 DIAGNOSIS — N200 Calculus of kidney: Secondary | ICD-10-CM

## 2016-09-18 NOTE — Progress Notes (Signed)
09/18/2016 3:31 PM   Lance Garcia 1985-11-19 [id number]  Referring provider: No referring provider defined for this encounter.  Chief Complaint  Patient presents with  . Follow-up    nephrolithiasis    HPI: 31 y.o.patient with retained left distal ureteral stone and history of left ureteral stone impaction the remote past. Stone was found to be impacted in left ureteral wall at time of surgery. He presents today to discuss his post-instrumentation renal ultrasound which is unremarkable. He has had 2 stone episodes in his life. 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 He admittedly does not take drink enough fluid.  His stone was 25% calcium oxalate dihydrate, 70% calcium oxalate monohydrate, 5% calcium phosphate.     PMH: Past Medical History:  Diagnosis Date  . Anxiety   . Family history of adverse reaction to anesthesia    PTS MOM-N/V  . Headache    h/o migraines  . History of kidney stones     Surgical History: Past Surgical History:  Procedure Laterality Date  . CYSTOSCOPY WITH STENT PLACEMENT Left 08/03/2016   Procedure: CYSTOSCOPY WITH STENT PLACEMENT;  Surgeon: Vanna ScotlandAshley Brandon, MD;  Location: [facility] ORS;  Service: Urology;  Laterality: Left;  . KIDNEY STONE SURGERY    . URETEROSCOPY WITH HOLMIUM LASER LITHOTRIPSY Left 08/03/2016   Procedure: URETEROSCOPY WITH HOLMIUM LASER LITHOTRIPSY;  Surgeon: Vanna ScotlandAshley Brandon, MD;  Location: [facility] ORS;  Service: Urology;  Laterality: Left;    Home Medications:  Allergies as of 09/18/2016   No Known Allergies     Medication List       Accurate as of 09/18/16  3:31 PM. Always use your most recent med list.          ciprofloxacin 500 MG tablet Commonly known as:  CIPRO Take 1 tablet (500 mg total) by mouth 2 (two) times daily.   docusate sodium 100 MG capsule Commonly known as:  COLACE Take 1 capsule (100 mg total) by mouth 2 (two) times  daily.   ondansetron 4 MG disintegrating tablet Commonly known as:  ZOFRAN ODT Take 1 tablet (4 mg total) by mouth every 8 (eight) hours as needed for nausea or vomiting.   oxybutynin 5 MG tablet Commonly known as:  DITROPAN Take 1 tablet (5 mg total) by mouth every 8 (eight) hours as needed for bladder spasms.   oxyCODONE-acetaminophen 10-325 MG tablet Commonly known as:  PERCOCET Take 1 tablet by mouth every 6 (six) hours as needed for pain.   tamsulosin 0.4 MG Caps capsule Commonly known as:  FLOMAX Take 1 capsule (0.4 mg total) by mouth daily.       Allergies: No Known Allergies  Family History: No family history on file.  Social History:  reports that he has been smoking Cigarettes.  He has a 15.00 pack-year smoking history. He has never used smokeless tobacco. He reports that he does not drink alcohol or use drugs.  ROS: UROLOGY Frequent Urination?: No Hard to postpone urination?: No Burning/pain with urination?: No Get up at night to urinate?: Yes Leakage of urine?: No Urine stream starts and stops?: No Trouble starting stream?: No Do you have to strain to urinate?: No Blood in urine?: No Urinary tract infection?: No Sexually transmitted disease?: No Injury to kidneys or bladder?: No Painful intercourse?: No Weak stream?: No Erection problems?: No Penile pain?: No  Gastrointestinal Nausea?: No Vomiting?: No Indigestion/heartburn?: Yes Diarrhea?: No  Constipation?: No  Constitutional Fever: No Night sweats?: No Weight loss?: No Fatigue?: Yes  Skin Skin rash/lesions?: No Itching?: No  Eyes Blurred vision?: No Double vision?: No  Ears/Nose/Throat Sore throat?: No Sinus problems?: No  Hematologic/Lymphatic Swollen glands?: No Easy bruising?: No  Cardiovascular Leg swelling?: No Chest pain?: No  Respiratory Cough?: No Shortness of breath?: No  Endocrine Excessive thirst?: No  Musculoskeletal Back pain?: No Joint pain?:  No  Neurological Headaches?: No Dizziness?: No  Psychologic Depression?: No Anxiety?: No  Physical Exam: BP 123/74   Pulse 65   Ht 5\' 8"  (1.727 m)   Wt 146 lb 12.8 oz (66.6 kg)   BMI 22.32 kg/m   Constitutional:  Alert and oriented, No acute distress. HEENT: [place] AT, moist mucus membranes.  Trachea midline, no masses. Cardiovascular: No clubbing, cyanosis, or edema. Respiratory: Normal respiratory effort, no increased work of breathing. GI: Abdomen is soft, nontender, nondistended, no abdominal masses GU: No CVA tenderness.  Skin: No rashes, bruises or suspicious lesions. Lymph: No cervical or inguinal adenopathy. Neurologic: Grossly intact, no focal deficits, moving all 4 extremities. Psychiatric: Normal mood and affect.  Laboratory Data: Lab Results  Component Value Date   WBC 11.9 (H) 07/23/2016   HGB 14.3 07/23/2016   HCT 41.6 07/23/2016   MCV 85.5 07/23/2016   PLT 246 07/23/2016    Lab Results  Component Value Date   CREATININE 1.24 07/23/2016    No results found for: PSA  No results found for: TESTOSTERONE  No results found for: HGBA1C  Urinalysis    Component Value Date/Time   COLORURINE YELLOW (A) 07/23/2016 0415   APPEARANCEUR Cloudy (A) 08/20/2016 1226   LABSPEC 1.028 07/23/2016 0415   LABSPEC 1.026 11/09/2014 0849   PHURINE 5.0 07/23/2016 0415   GLUCOSEU Negative 08/20/2016 1226   GLUCOSEU Negative 11/09/2014 0849   HGBUR SMALL (A) 07/23/2016 0415   BILIRUBINUR Negative 08/20/2016 1226   BILIRUBINUR Negative 11/09/2014 0849   KETONESUR 5 (A) 07/23/2016 0415   PROTEINUR 3+ (A) 08/20/2016 1226   PROTEINUR 30 (A) 07/23/2016 0415   NITRITE Negative 08/20/2016 1226   NITRITE NEGATIVE 07/23/2016 0415   LEUKOCYTESUR 1+ (A) 08/20/2016 1226   LEUKOCYTESUR Negative 11/09/2014 0849    Pertinent Imaging: No hydronephrosis on renal ultrasound as above.  Assessment & Plan:    1. Nephrolithiasis Over the patient's stone analysis with him and his  wife. We discussed ways to prevent stones in the future and went over the ABCs of stone formation booklet. We also discussed that his job often leads to dehydration and that he needs to do a better job of stay hydrated particular in the summer months working outside. He has had 2 stone episodes in his life though they were 15 years apart. We did briefly discuss 24-hour urine collection, and we've decided not to order that at this time. If he does develop more frequent stones, he will need to consider undergoing this study. He'll follow-up with [place] as needed.   Return if symptoms worsen or fail to improve.  Hildred Laser, MD  [facility] [address] [place], [place] [address] [phone]

## 2016-12-15 ENCOUNTER — Encounter: Payer: Self-pay | Admitting: Emergency Medicine

## 2016-12-15 ENCOUNTER — Emergency Department
Admission: EM | Admit: 2016-12-15 | Discharge: 2016-12-15 | Disposition: A | Discharge status: Home / Self Care | Payer: Self-pay | Attending: Emergency Medicine | Admitting: Emergency Medicine

## 2016-12-15 ENCOUNTER — Emergency Department: Payer: Self-pay

## 2016-12-15 DIAGNOSIS — S60111A Contusion of right thumb with damage to nail, initial encounter: Secondary | ICD-10-CM | POA: Insufficient documentation

## 2016-12-15 DIAGNOSIS — W278XXA Contact with other nonpowered hand tool, initial encounter: Secondary | ICD-10-CM | POA: Insufficient documentation

## 2016-12-15 DIAGNOSIS — Z960 Presence of urogenital implants: Secondary | ICD-10-CM | POA: Insufficient documentation

## 2016-12-15 DIAGNOSIS — Y9289 Other specified places as the place of occurrence of the external cause: Secondary | ICD-10-CM | POA: Insufficient documentation

## 2016-12-15 DIAGNOSIS — Y9389 Activity, other specified: Secondary | ICD-10-CM | POA: Insufficient documentation

## 2016-12-15 DIAGNOSIS — F17210 Nicotine dependence, cigarettes, uncomplicated: Secondary | ICD-10-CM | POA: Insufficient documentation

## 2016-12-15 DIAGNOSIS — Z79899 Other long term (current) drug therapy: Secondary | ICD-10-CM | POA: Insufficient documentation

## 2016-12-15 DIAGNOSIS — Y990 Civilian activity done for income or pay: Secondary | ICD-10-CM | POA: Insufficient documentation

## 2016-12-15 MED ORDER — NAPROXEN 500 MG PO TBEC: 500.0000 mg | DELAYED_RELEASE_TABLET | Freq: Two times a day (BID) | ORAL | 0 refills | Status: AC

## 2016-12-15 NOTE — ED Provider Notes (Signed)
[facility] Emergency Department Provider Note  ____________________________________________  Time seen: Approximately 7:59 PM  I have reviewed the triage vital signs and the nursing notes.   HISTORY  Chief Complaint Hand Pain    HPI Lance Garcia is a 31 y.o. male presenting to the emergency department with acute and aching 5 out of 10 left first digit pain after accidentally striking his left thumb with a hammer at work. He has been able to actively flex and extend the left thumb. He has not experienced left upper extremity avoidance. He has experienced no changes in sensation, radiculopathy or paresthesias of the right upper extremity. Patient denies prior trauma or surgeries to the right upper extremity. No alleviating measures have been attempted.   Past Medical History:  Diagnosis Date  . Anxiety   . Family history of adverse reaction to anesthesia    PTS MOM-N/V  . Headache    h/o migraines  . History of kidney stones     There are no active problems to display for this patient.   Past Surgical History:  Procedure Laterality Date  . CYSTOSCOPY WITH STENT PLACEMENT Left 08/03/2016   Procedure: CYSTOSCOPY WITH STENT PLACEMENT;  Surgeon: Vanna Scotland, MD;  Location: [facility] ORS;  Service: Urology;  Laterality: Left;  . KIDNEY STONE SURGERY    . URETEROSCOPY WITH HOLMIUM LASER LITHOTRIPSY Left 08/03/2016   Procedure: URETEROSCOPY WITH HOLMIUM LASER LITHOTRIPSY;  Surgeon: Vanna Scotland, MD;  Location: [facility] ORS;  Service: Urology;  Laterality: Left;    Prior to Admission medications   Medication Sig Start Date End Date Taking? Authorizing Provider  ciprofloxacin (CIPRO) 500 MG tablet Take 1 tablet (500 mg total) by mouth 2 (two) times daily. Patient not taking: Reported on 07/31/2016 07/23/16   Irean Hong, MD  docusate sodium (COLACE) 100 MG capsule Take 1 capsule (100 mg total) by mouth 2 (two) times daily. Patient not taking: Reported on  08/20/2016 08/03/16   Vanna Scotland, MD  naproxen (EC NAPROSYN) 500 MG EC tablet Take 1 tablet (500 mg total) by mouth 2 (two) times daily with a meal. 12/15/16 12/25/16  Pia Mau M, PA-C  ondansetron (ZOFRAN ODT) 4 MG disintegrating tablet Take 1 tablet (4 mg total) by mouth every 8 (eight) hours as needed for nausea or vomiting. Patient not taking: Reported on 08/20/2016 07/23/16   Irean Hong, MD  oxybutynin (DITROPAN) 5 MG tablet Take 1 tablet (5 mg total) by mouth every 8 (eight) hours as needed for bladder spasms. 08/03/16   Vanna Scotland, MD  oxyCODONE-acetaminophen (PERCOCET) 10-325 MG tablet Take 1 tablet by mouth every 6 (six) hours as needed for pain. 08/03/16   Vanna Scotland, MD  tamsulosin (FLOMAX) 0.4 MG CAPS capsule Take 1 capsule (0.4 mg total) by mouth daily. 07/23/16   Irean Hong, MD    Allergies Patient has no known allergies.  No family history on file.  Social History Social History  Substance Use Topics  . Smoking status: Current Every Day Smoker    Packs/day: 1.00    Years: 15.00    Types: Cigarettes  . Smokeless tobacco: Never Used  . Alcohol use No     Review of Systems  Constitutional: No fever/chills Eyes: No visual changes. No discharge ENT: No upper respiratory complaints. Cardiovascular: no chest pain. Respiratory: no cough. No SOB. Musculoskeletal: Patient has left thumb pain Skin: Patient has subungual hematoma Neurological: Negative for headaches, focal weakness or numbness. ____________________________________________   PHYSICAL EXAM:  VITAL SIGNS: ED Triage Vitals  Enc Vitals Group     BP 12/15/16 1802 126/78     Pulse Rate 12/15/16 1802 83     Resp 12/15/16 1802 18     Temp 12/15/16 1802 98.7 F (37.1 C)     Temp Source 12/15/16 1802 Oral     SpO2 12/15/16 1802 100 %     Weight 12/15/16 1810 147 lb (66.7 kg)     Height 12/15/16 1810 5\' 8"  (1.727 m)     Head Circumference --      Peak Flow --      Pain Score 12/15/16 1810 7      Pain Loc --      Pain Edu? --      Excl. in GC? --      Constitutional: Alert and oriented. Well appearing and in no acute distress. Eyes: Conjunctivae are normal. PERRL. EOMI. Head: Atraumatic. Cardiovascular: Normal rate, regular rhythm. Normal S1 and S2.  Good peripheral circulation. Respiratory: Normal respiratory effort without tachypnea or retractions. Lungs CTAB. Good air entry to the bases with no decreased or absent breath sounds. Musculoskeletal: Patient has 5 out of 5 strength in the upper extremities bilaterally. Patient is able to flex and extend the left first digit actively. Patient is able to perform resisted flexion and extension at the first left digit. Palpable radial and ulnar pulses bilaterally and symmetrically. Neurologic:  Normal speech and language. No gross focal neurologic deficits are appreciated.  Skin: Patient has subungual hematoma. Psychiatric: Mood and affect are normal. Speech and behavior are normal. Patient exhibits appropriate insight and judgement.   ____________________________________________   LABS (all labs ordered are listed, but only abnormal results are displayed)  Labs Reviewed - No data to display ____________________________________________  EKG   ____________________________________________  RADIOLOGY Geraldo Pitter, personally viewed and evaluated these images (plain radiographs) as part of my medical decision making, as well as reviewing the written report by the radiologist.  Dg Hand Complete Left  Result Date: 12/15/2016 CLINICAL DATA:  Left thumb pain after trauma by hammer. EXAM: LEFT HAND - COMPLETE 3+ VIEW COMPARISON:  None. FINDINGS: There is no evidence of fracture or dislocation. There is no evidence of arthropathy or other focal bone abnormality. Soft tissues are unremarkable. IMPRESSION: No acute fracture or malalignment of the left hand with attention to the thumb. Electronically Signed   By: Tollie Eth M.D.   On:  12/15/2016 18:36    ____________________________________________    PROCEDURES  Procedure(s) performed:    Procedures    Medications - No data to display   ____________________________________________   INITIAL IMPRESSION / ASSESSMENT AND PLAN / ED COURSE  Pertinent labs & imaging results that were available during my care of the patient were reviewed by me and considered in my medical decision making (see chart for details).  Review of the [place] CSRS was performed in accordance of the [facility] prior to dispensing any controlled drugs.     Assessment and plan: Left Thumb Pain: Patient presents to the emergency department with left first digit pain after accidentally striking his left thumb with a hammer. Patient is able to flex and extend left thumb actively with no deficits in flexion and extension. DG left thumb reveals no acute fractures or bony abnormalities. Patient declined trephination in the emergency department for left subungual hematoma. A referral was made to orthopedics, Dr. Martha Clan. Vital signs were reassuring at discharge. Patient was discharged with naproxen. All patient questions  were answered.    ____________________________________________  FINAL CLINICAL IMPRESSION(S) / ED DIAGNOSES  Final diagnoses:  Contusion of right thumb with damage to nail, initial encounter      NEW MEDICATIONS STARTED DURING THIS VISIT:  Discharge Medication List as of 12/15/2016  7:33 PM    START taking these medications   Details  naproxen (EC NAPROSYN) 500 MG EC tablet Take 1 tablet (500 mg total) by mouth 2 (two) times daily with a meal., Starting Tue 12/15/2016, Until Fri 12/25/2016, Print            This chart was dictated using voice recognition software/Dragon. Despite best efforts to proofread, errors can occur which can change the meaning. Any change was purely unintentional.    Gasper LloydWoods, Yliana Gravois M, PA-C 12/15/16 2012    Myrna BlazerSchaevitz, David Matthew,  MD 12/15/16 2330

## 2016-12-15 NOTE — ED Triage Notes (Signed)
States he smashed his left thumb with a hammer  Bruising and swelling noted under fingernail

## 2017-10-10 ENCOUNTER — Emergency Department
Admission: EM | Admit: 2017-10-10 | Discharge: 2017-10-10 | Disposition: A | Discharge status: Home / Self Care | Payer: Self-pay | Attending: Emergency Medicine | Admitting: Emergency Medicine

## 2017-10-10 ENCOUNTER — Emergency Department: Payer: Self-pay

## 2017-10-10 ENCOUNTER — Other Ambulatory Visit: Payer: Self-pay

## 2017-10-10 DIAGNOSIS — Z79899 Other long term (current) drug therapy: Secondary | ICD-10-CM | POA: Insufficient documentation

## 2017-10-10 DIAGNOSIS — F17210 Nicotine dependence, cigarettes, uncomplicated: Secondary | ICD-10-CM | POA: Insufficient documentation

## 2017-10-10 DIAGNOSIS — R0789 Other chest pain: Secondary | ICD-10-CM | POA: Insufficient documentation

## 2017-10-10 LAB — BASIC METABOLIC PANEL
Anion gap: 10 (ref 5–15)
BUN: 15 mg/dL (ref 6–20)
CALCIUM: 9 mg/dL (ref 8.9–10.3)
CO2: 23 mmol/L (ref 22–32)
Chloride: 105 mmol/L (ref 101–111)
Creatinine, Ser: 1.04 mg/dL (ref 0.61–1.24)
GFR calc Af Amer: >60 mL/min (ref >60)
GLUCOSE: 135 mg/dL — AB (ref 65–99)
Potassium: 4.2 mmol/L (ref 3.5–5.1)
SODIUM: 138 mmol/L (ref 135–145)

## 2017-10-10 LAB — CBC
HCT: 42.3 % (ref 40.0–52.0)
Hemoglobin: 14.4 g/dL (ref 13.0–18.0)
MCH: 29.6 pg (ref 26.0–34.0)
MCHC: 34 g/dL (ref 32.0–36.0)
MCV: 87.3 fL (ref 80.0–100.0)
Platelets: 250 10*3/uL (ref 150–440)
RBC: 4.85 MIL/uL (ref 4.40–5.90)
RDW: 12.8 % (ref 11.5–14.5)
WBC: 11.2 10*3/uL — AB (ref 3.8–10.6)

## 2017-10-10 LAB — TROPONIN I

## 2017-10-10 MED ORDER — KETOROLAC TROMETHAMINE 30 MG/ML IJ SOLN
30.0000 mg | Freq: Once | INTRAMUSCULAR | Status: AC
  Administered 2017-10-10: 30 mg via INTRAMUSCULAR
  Filled 2017-10-10: qty 1

## 2017-10-10 MED ORDER — CARISOPRODOL 350 MG PO TABS: 350.0000 mg | ORAL_TABLET | Freq: Three times a day (TID) | ORAL | 0 refills | Status: DC | PRN

## 2017-10-10 MED ORDER — OXYCODONE-ACETAMINOPHEN 5-325 MG PO TABS
1.0000 | ORAL_TABLET | Freq: Once | ORAL | Status: AC
  Administered 2017-10-10: 1 via ORAL
  Filled 2017-10-10: qty 1

## 2017-10-10 NOTE — ED Provider Notes (Signed)
[facility] Emergency Department Provider Note  ____________________________________________   First MD Initiated Contact with Patient 10/10/17 [phone]     (approximate)  I have reviewed the triage vital signs and the nursing notes.   HISTORY  Chief Complaint Chest Pain   HPI Lance Garcia is a 32 y.o. male with a history of anxiety as well as kidney stones was presenting to the emergency department left-sided chest pain over the past month.  He says the pain comes and goes and is usually worse in the morning and better throughout the day as he works.  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  He says the pain feels like a burning pain over the left side of the chest which radiates into his left shoulder.  He says the pain is about a 7 out of 10 right now.  Patient has not tried any OTC pain meds at home as he states that he does not like to take any medicine.  Says the pain is better when he pushes on his left anterior chest wall but worse when he moves his shoulders posteriorly or pushes closer to the axilla..  Says the pain also worsens with inspiration.  However, does not report shortness of breath, nausea or vomiting.  Patient smokes about a pack of cigarettes per day.  Past Medical History:  Diagnosis Date  . Anxiety   . Family history of adverse reaction to anesthesia    PTS MOM-N/V  . Headache    h/o migraines  . History of kidney stones     There are no active problems to display for this patient.   Past Surgical History:  Procedure Laterality Date  . CYSTOSCOPY WITH STENT PLACEMENT Left 08/03/2016   Procedure: CYSTOSCOPY WITH STENT PLACEMENT;  Surgeon: Vanna Scotland, MD;  Location: [facility] ORS;  Service: Urology;  Laterality: Left;  . KIDNEY STONE SURGERY    . URETEROSCOPY WITH HOLMIUM LASER LITHOTRIPSY Left 08/03/2016   Procedure: URETEROSCOPY  WITH HOLMIUM LASER LITHOTRIPSY;  Surgeon: Vanna Scotland, MD;  Location: [facility] ORS;  Service: Urology;  Laterality: Left;    Prior to Admission medications   Medication Sig Start Date End Date Taking? Authorizing Provider  ciprofloxacin (CIPRO) 500 MG tablet Take 1 tablet (500 mg total) by mouth 2 (two) times daily. Patient not taking: Reported on 07/31/2016 07/23/16   Irean Hong, MD  docusate sodium (COLACE) 100 MG capsule Take 1 capsule (100 mg total) by mouth 2 (two) times daily. Patient not taking: Reported on 08/20/2016 08/03/16   Vanna Scotland, MD  ondansetron (ZOFRAN ODT) 4 MG disintegrating tablet Take 1 tablet (4 mg total) by mouth every 8 (eight) hours as needed for nausea or vomiting. Patient not taking: Reported on 08/20/2016 07/23/16   Irean Hong, MD  oxybutynin (DITROPAN) 5 MG tablet Take 1 tablet (5 mg total) by mouth every 8 (eight) hours as needed for bladder spasms. 08/03/16   Vanna Scotland, MD  oxyCODONE-acetaminophen (PERCOCET) 10-325 MG tablet Take 1 tablet by mouth every 6 (six) hours as needed for pain. 08/03/16   Vanna Scotland, MD  tamsulosin (FLOMAX) 0.4 MG CAPS capsule Take 1 capsule (0.4 mg total) by mouth daily. 07/23/16   Irean Hong, MD    Allergies Patient has no known allergies.  No family history on file.  Social History Social History  Tobacco Use  . Smoking status: Current Every Day Smoker    Packs/day: 1.00    Years: 15.00    Pack years: 15.00    Types: Cigarettes  . Smokeless tobacco: Never Used  Substance Use Topics  . Alcohol use: No  . Drug use: No    Review of Systems  Constitutional: No fever/chills Eyes: No visual changes. ENT: No sore throat. Cardiovascular: As above Respiratory: Denies shortness of breath. Gastrointestinal: No abdominal pain.  No nausea, no vomiting.  No diarrhea.  No constipation. Genitourinary: Negative for dysuria. Musculoskeletal: Negative for back pain. Skin: Negative for rash. Neurological: Negative for  headaches, focal weakness or numbness.   ____________________________________________   PHYSICAL EXAM:  VITAL SIGNS: ED Triage Vitals  Enc Vitals Group     BP 10/10/17 0551 (!) 154/99     Pulse Rate 10/10/17 0551 (!) 103     Resp 10/10/17 0551 20     Temp 10/10/17 0551 (!) 97.5 F (36.4 C)     Temp Source 10/10/17 0551 Oral     SpO2 10/10/17 0551 100 %     Weight 10/10/17 0552 155 lb (70.3 kg)     Height 10/10/17 0552 5\' 8"  (1.727 m)     Head Circumference --      Peak Flow --      Pain Score 10/10/17 0551 6     Pain Loc --      Pain Edu? --      Excl. in GC? --     Constitutional: Alert and oriented. Well appearing and in no acute distress. Eyes: Conjunctivae are normal.  Head: Atraumatic. Nose: No congestion/rhinnorhea. Mouth/Throat: Mucous membranes are moist.  Neck: No stridor.   Cardiovascular: Normal rate, regular rhythm. Grossly normal heart sounds.  Good peripheral circulation with equal and bilateral radial pulses. Respiratory: Normal respiratory effort.  No retractions. Lungs CTAB. Gastrointestinal: Soft and nontender. No distention.  Musculoskeletal: No lower extremity tenderness nor edema.  No joint effusions. Neurologic:  Normal speech and language. No gross focal neurologic deficits are appreciated. Skin:  Skin is warm, dry and intact. No rash noted. Psychiatric: Mood and affect are normal. Speech and behavior are normal.  ____________________________________________   LABS (all labs ordered are listed, but only abnormal results are displayed)  Labs Reviewed  BASIC METABOLIC PANEL - Abnormal; Notable for the following components:      Result Value   Glucose, Bld 135 (*)    All other components within normal limits  CBC - Abnormal; Notable for the following components:   WBC 11.2 (*)    All other components within normal limits  TROPONIN I   ____________________________________________  EKG  ED ECG REPORT I, Arelia Longest, the attending  physician, personally viewed and interpreted this ECG.   Date: 10/10/2017  EKG Time: 0555  Rate: 78  Rhythm: normal sinus rhythm  Axis: Normal  Intervals:none  ST&T Change: No ST segment elevation or depression.  No abnormal T wave inversion.  ____________________________________________  RADIOLOGY  No acute finding on the chest x-ray. ____________________________________________   PROCEDURES  Procedure(s) performed:   Procedures  Critical Care performed:   ____________________________________________   INITIAL IMPRESSION / ASSESSMENT AND PLAN / ED COURSE  Pertinent labs & imaging results that were available during my care of the patient were reviewed by me and considered in my medical decision making (see chart for details).  Differential diagnosis includes, but is not limited to, ACS, aortic dissection, pulmonary embolism, cardiac tamponade, pneumothorax,  pneumonia, pericarditis, myocarditis, GI-related causes including esophagitis/gastritis, and musculoskeletal chest wall pain.   As part of my medical decision making, I reviewed the following data within the electronic MEDICAL RECORD NUMBER Notes from prior ED visits   Patient's heart rate now persistently in the 70s despite it being initially 103 in triage.  Very reassuring EKG.  Wells score of 0-1.5 depending if you count the initial heart rate greater than 100.  Also very atypical story for ACS.  Likely chest wall pain.  Will treat with Percocet as well as Toradol and reassess.  ----------------------------------------- 7:02 AM on 10/10/2017 -----------------------------------------  Patient says pain significantly relieved after medications.  However still with tenderness when he pushes in the axilla.  Very low risk for ACS as well as pulmonary embolus.  Heart rate remains in the 70s and 80s.  Due to the patient's exam as well as history and good response to anti-inflammatory medication I believe that this is likely  chest wall pain.  He will be discharged with a muscle relaxer and I recommend that he take ibuprofen at home for pain relief even now does not like to take medication.  He is understanding of this plan willing to comply. ____________________________________________   FINAL CLINICAL IMPRESSION(S) / ED DIAGNOSES  Chest wall pain.    NEW MEDICATIONS STARTED DURING THIS VISIT:  New Prescriptions   No medications on file     Note:  This document was prepared using Dragon voice recognition software and may include unintentional dictation errors.     Myrna BlazerSchaevitz, Chala Gul Matthew, MD 10/10/17 [phone]

## 2017-10-10 NOTE — ED Triage Notes (Signed)
Patient reports chest pain off/on for past month.  Tonight pain woke him up.  Patient points across chest when ask where pain is.

## 2017-10-10 NOTE — ED Notes (Signed)
Pt reports chest pain for about a month today pain woke him up from sleep reports burning sensation and point to sternum and radiating to left side

## 2017-10-24 IMAGING — CR DG ABDOMEN 1V
1 series · 2 of 2 positions shown · non-contrast
Comparison: CT abdomen and pelvis 06/22/2016.  Abdomen 05/06/2016.

CLINICAL DATA: Left lower quadrant pain beginning at midnight.
Diagnosed with a kidney stone last month.

EXAM:
ABDOMEN - 1 VIEW

[Series 1: dg abd 1 view · 0.14mm/px · 2 of 2 slices shown]
[im 1/2]
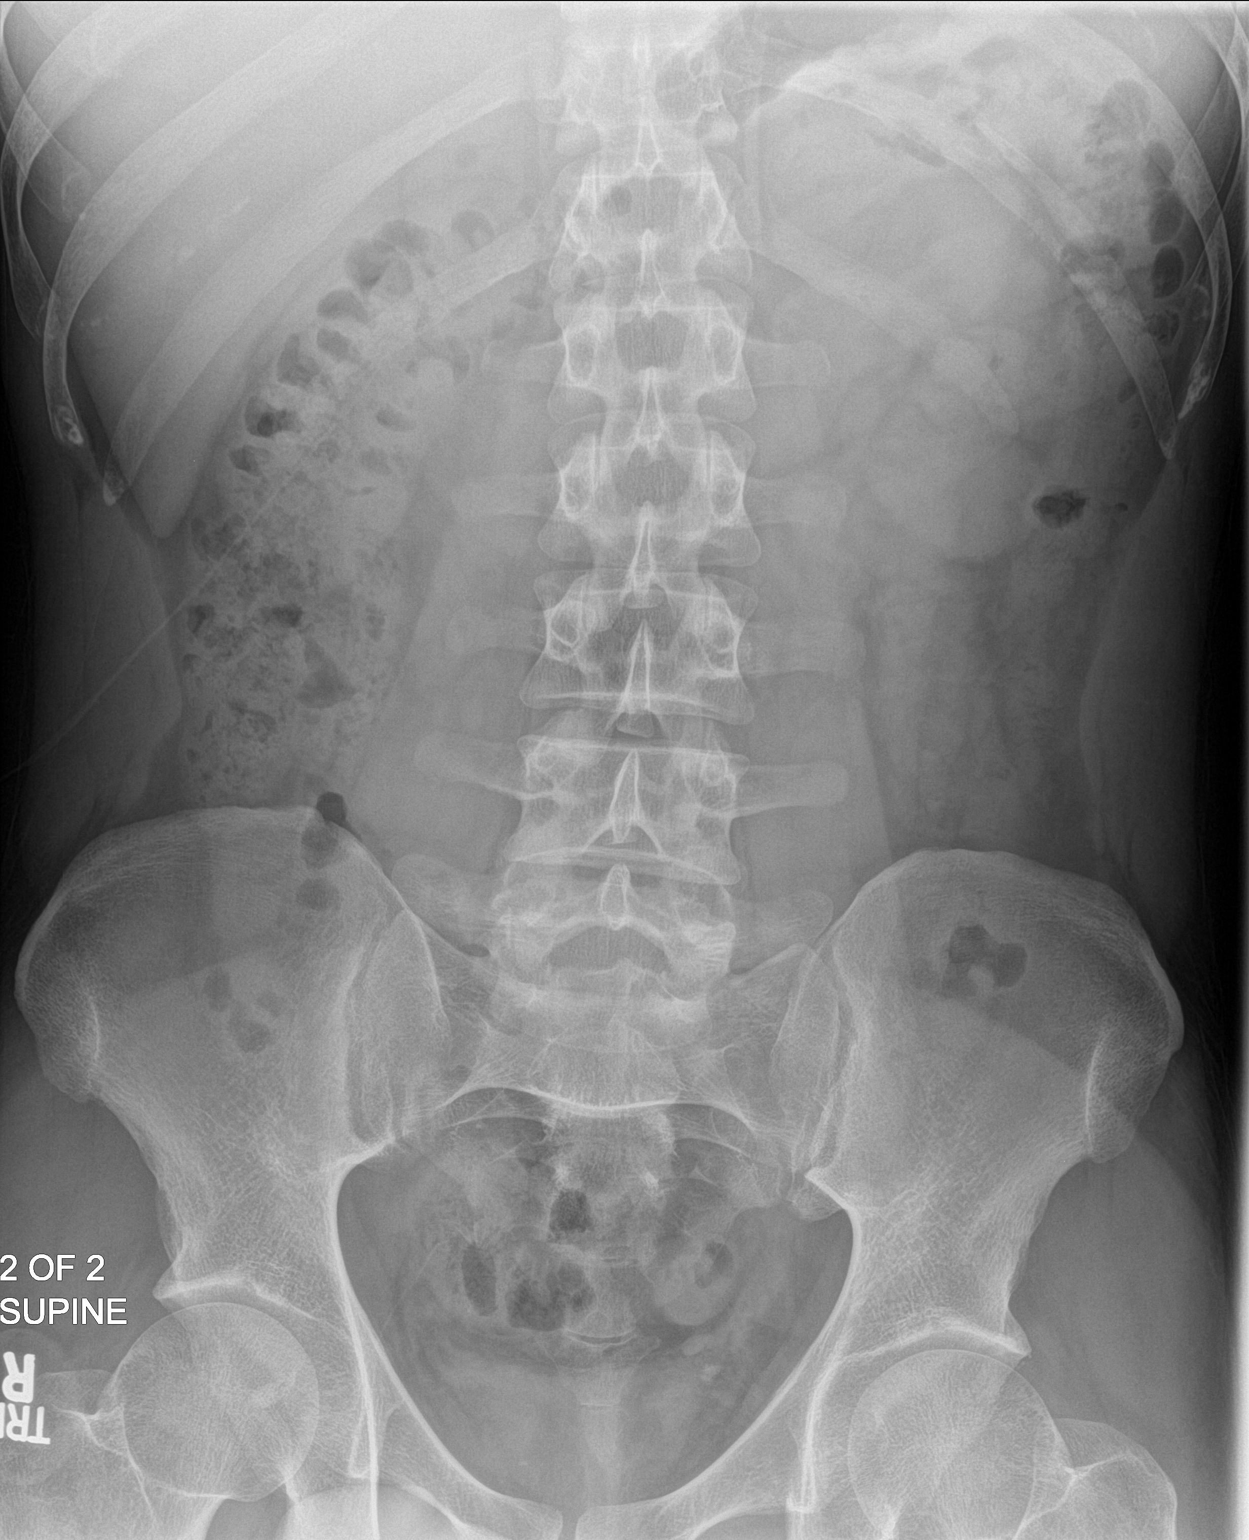
[im 2/2]
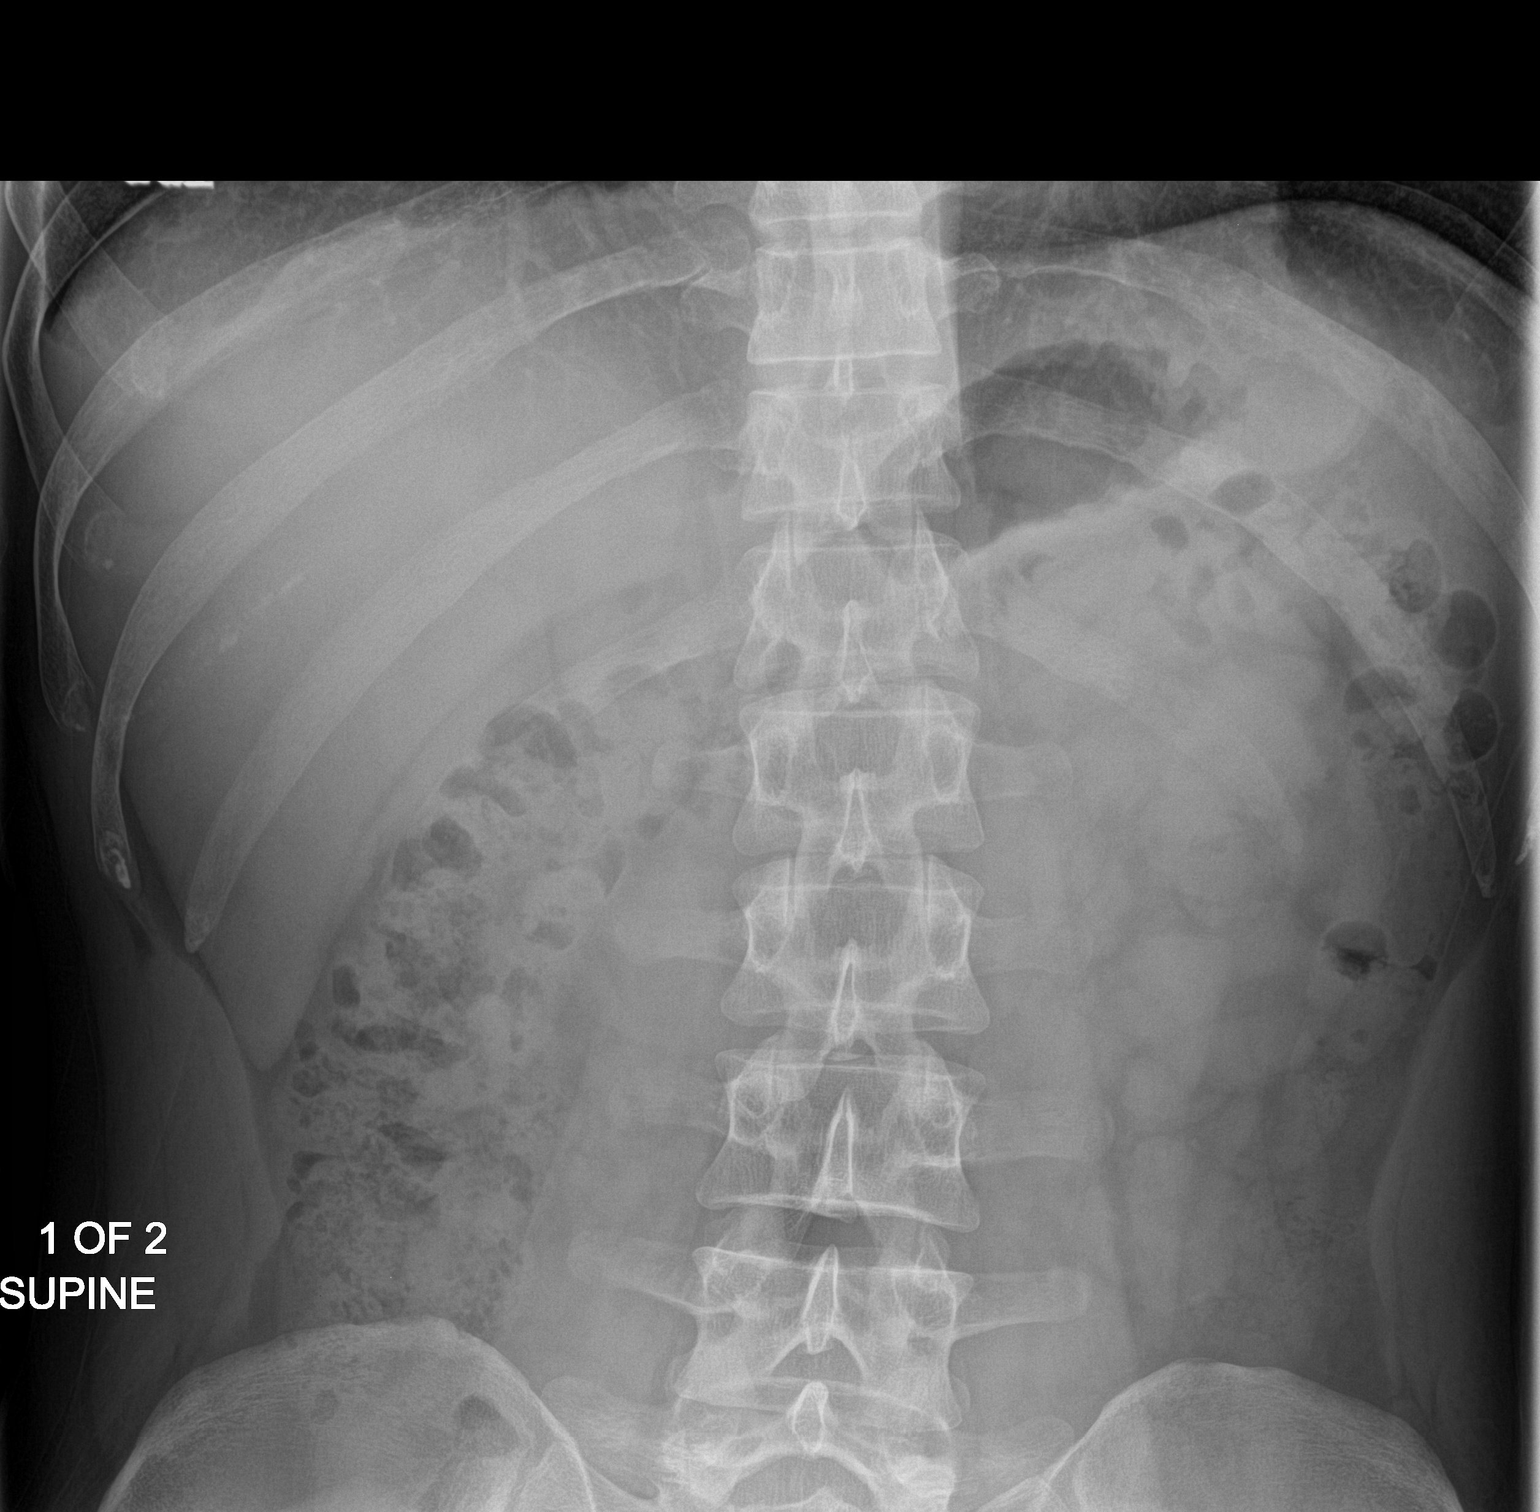

[2 of 2 positions shown; findings below may reference images not displayed]

FINDINGS: 5 mm stone demonstrated in the left pelvis likely corresponding to
the stones seen in the distal left ureter on prior CT. No additional
renal or ureteral stones identified. Gas and stool throughout the
colon. No small or large bowel distention. Visualized bones appear
intact.
IMPRESSION: Calculus in the left pelvis likely representing a distal left
ureteral stone. Similar position to previous CT.

## 2018-07-11 ENCOUNTER — Emergency Department
Admission: EM | Admit: 2018-07-11 | Discharge: 2018-07-11 | Disposition: A | Discharge status: Home / Self Care | Payer: Self-pay | Attending: Emergency Medicine | Admitting: Emergency Medicine

## 2018-07-11 ENCOUNTER — Other Ambulatory Visit: Payer: Self-pay

## 2018-07-11 DIAGNOSIS — T1501XA Foreign body in cornea, right eye, initial encounter: Secondary | ICD-10-CM | POA: Insufficient documentation

## 2018-07-11 DIAGNOSIS — Y9389 Activity, other specified: Secondary | ICD-10-CM | POA: Insufficient documentation

## 2018-07-11 DIAGNOSIS — X58XXXA Exposure to other specified factors, initial encounter: Secondary | ICD-10-CM | POA: Insufficient documentation

## 2018-07-11 DIAGNOSIS — F17210 Nicotine dependence, cigarettes, uncomplicated: Secondary | ICD-10-CM | POA: Insufficient documentation

## 2018-07-11 DIAGNOSIS — H18892 Other specified disorders of cornea, left eye: Secondary | ICD-10-CM | POA: Insufficient documentation

## 2018-07-11 DIAGNOSIS — Z79899 Other long term (current) drug therapy: Secondary | ICD-10-CM | POA: Insufficient documentation

## 2018-07-11 DIAGNOSIS — F419 Anxiety disorder, unspecified: Secondary | ICD-10-CM | POA: Insufficient documentation

## 2018-07-11 DIAGNOSIS — T1592XA Foreign body on external eye, part unspecified, left eye, initial encounter: Secondary | ICD-10-CM

## 2018-07-11 DIAGNOSIS — Y929 Unspecified place or not applicable: Secondary | ICD-10-CM | POA: Insufficient documentation

## 2018-07-11 DIAGNOSIS — Y998 Other external cause status: Secondary | ICD-10-CM | POA: Insufficient documentation

## 2018-07-11 MED ORDER — CIPROFLOXACIN HCL 0.3 % OP SOLN: 1.0000 [drp] | OPHTHALMIC | 0 refills | Status: AC

## 2018-07-11 MED ORDER — KETOROLAC TROMETHAMINE 0.4 % OP SOLN: 1.0000 [drp] | Freq: Three times a day (TID) | OPHTHALMIC | 0 refills | Status: AC | PRN

## 2018-07-11 MED ORDER — TETRACAINE HCL 0.5 % OP SOLN
2.0000 [drp] | Freq: Once | OPHTHALMIC | Status: AC
  Administered 2018-07-11: 2 [drp] via OPHTHALMIC
  Filled 2018-07-11: qty 4

## 2018-07-11 MED ORDER — KETOROLAC TROMETHAMINE 0.4 % OP SOLN: 1.0000 [drp] | Freq: Three times a day (TID) | OPHTHALMIC | 0 refills | Status: DC | PRN

## 2018-07-11 NOTE — Discharge Instructions (Addendum)
There is still some residual metal in your left eye from the piece of metal that was removed.  This residual material will need to be drilled out by ophthalmology.  Please call ophthalmology tomorrow for an appointment.  Your eye will still be uncomfortable.  Please use NSAID eyedrops for irritation and begin antibiotic eyedrops to prevent any infection.

## 2018-07-11 NOTE — ED Notes (Signed)
Reference triage note. Pt right eye noted to be red. Pt c/o decreased vision in this eye that began on Saturday. 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

## 2018-07-11 NOTE — ED Provider Notes (Signed)
[facility] Emergency Department Provider Note  ____________________________________________  Time seen: Approximately 5:17 PM  I have reviewed the triage vital signs and the nursing notes.   HISTORY  Chief Complaint Eye Problem    HPI Lance Garcia is a 32 y.o. male that presents emergency department for evaluation of metal foreign body in right eye.  Patient states that he was working with metal when the speck flew into his eye on Friday.  He did not notice the piece until Sunday morning.  He tried to flush out his eye and use a Q-tip without relief.  His eye has been watering, which is been making it difficult to see.   Past Medical History:  Diagnosis Date  . Anxiety   . Family history of adverse reaction to anesthesia    PTS MOM-N/V  . Headache    h/o migraines  . History of kidney stones     There are no active problems to display for this patient.   Past Surgical History:  Procedure Laterality Date  . CYSTOSCOPY WITH STENT PLACEMENT Left 08/03/2016   Procedure: CYSTOSCOPY WITH STENT PLACEMENT;  Surgeon: Vanna ScotlandAshley Brandon, MD;  Location: [facility] ORS;  Service: Urology;  Laterality: Left;  . KIDNEY STONE SURGERY    . URETEROSCOPY WITH HOLMIUM LASER LITHOTRIPSY Left 08/03/2016   Procedure: URETEROSCOPY WITH HOLMIUM LASER LITHOTRIPSY;  Surgeon: Vanna ScotlandAshley Brandon, MD;  Location: [facility] ORS;  Service: Urology;  Laterality: Left;    Prior to Admission medications   Medication Sig Start Date End Date Taking? Authorizing Provider  carisoprodol (SOMA) 350 MG tablet Take 1 tablet (350 mg total) by mouth 3 (three) times daily as needed. 10/10/17   Schaevitz, Myra Rudeavid Matthew, MD  ciprofloxacin (CILOXAN) 0.3 % ophthalmic solution Place 1 drop into the left eye every 4 (four) hours while awake for 5 days. Administer 1 drop, every 2 hours, while awake, for 2 days. Then 1 drop, every 4 hours, while awake, for the next 5 days. 07/11/18 07/16/18  Enid DerryWagner, Blessed Girdner, PA-C   ciprofloxacin (CIPRO) 500 MG tablet Take 1 tablet (500 mg total) by mouth 2 (two) times daily. Patient not taking: Reported on 07/31/2016 07/23/16   Irean HongSung, Jade J, MD  docusate sodium (COLACE) 100 MG capsule Take 1 capsule (100 mg total) by mouth 2 (two) times daily. Patient not taking: Reported on 08/20/2016 08/03/16   Vanna ScotlandBrandon, Phallon Haydu, MD  ketorolac (ACULAR) 0.4 % SOLN Place 1 drop into the left eye 3 (three) times daily as needed for up to 4 days. 07/11/18 07/15/18  Enid DerryWagner, Krithik Mapel, PA-C  ondansetron (ZOFRAN ODT) 4 MG disintegrating tablet Take 1 tablet (4 mg total) by mouth every 8 (eight) hours as needed for nausea or vomiting. Patient not taking: Reported on 08/20/2016 07/23/16   Irean HongSung, Jade J, MD  oxybutynin (DITROPAN) 5 MG tablet Take 1 tablet (5 mg total) by mouth every 8 (eight) hours as needed for bladder spasms. 08/03/16   Vanna ScotlandBrandon, Jacqeline Broers, MD  oxyCODONE-acetaminophen (PERCOCET) 10-325 MG tablet Take 1 tablet by mouth every 6 (six) hours as needed for pain. 08/03/16   Vanna ScotlandBrandon, Herlinda Heady, MD  tamsulosin (FLOMAX) 0.4 MG CAPS capsule Take 1 capsule (0.4 mg total) by mouth daily. 07/23/16   Irean HongSung, Jade J, MD    Allergies Patient has no known allergies.  No family history on file.  Social History Social History   Tobacco Use  . Smoking status: Current Every Day Smoker    Packs/day: 1.00    Years: 15.00  Pack years: 15.00    Types: Cigarettes  . Smokeless tobacco: Never Used  Substance Use Topics  . Alcohol use: No  . Drug use: No     Review of Systems  Constitutional: No fever/chills ENT: No upper respiratory complaints. Cardiovascular: No chest pain. Respiratory:  No SOB. Gastrointestinal: No vomiting.  Musculoskeletal: Negative for musculoskeletal pain. Skin: Negative for rash, abrasions, lacerations, ecchymosis.   ____________________________________________   PHYSICAL EXAM:  VITAL SIGNS: ED Triage Vitals  Enc Vitals Group     BP 07/11/18 1614 114/77     Pulse Rate  07/11/18 1614 87     Resp 07/11/18 1614 18     Temp 07/11/18 1614 98 F (36.7 C)     Temp Source 07/11/18 1614 Oral     SpO2 07/11/18 1614 98 %     Weight 07/11/18 1615 150 lb (68 kg)     Height 07/11/18 1615 5\' 10"  (1.778 m)     Head Circumference --      Peak Flow --      Pain Score 07/11/18 1615 0     Pain Loc --      Pain Edu? --      Excl. in GC? --      Constitutional: Alert and oriented. Well appearing and in no acute distress. Eyes: Conjunctivae are normal. PERRL. EOMI. Metallic foreign body to 3pm position of iris.  Head: Atraumatic. ENT:      Ears:      Nose: No congestion/rhinnorhea.      Mouth/Throat: Mucous membranes are moist.  Neck: No stridor.   Cardiovascular: Normal rate, regular rhythm.  Good peripheral circulation. Respiratory: Normal respiratory effort without tachypnea or retractions. Lungs CTAB. Good air entry to the bases with no decreased or absent breath sounds. Musculoskeletal: Full range of motion to all extremities. No gross deformities appreciated. Neurologic:  Normal speech and language. No gross focal neurologic deficits are appreciated.  Skin:  Skin is warm, dry and intact. No rash noted. Psychiatric: Mood and affect are normal. Speech and behavior are normal. Patient exhibits appropriate insight and judgement.   ____________________________________________   LABS (all labs ordered are listed, but only abnormal results are displayed)  Labs Reviewed - No data to display ____________________________________________  EKG   ____________________________________________  RADIOLOGY   No results found.  ____________________________________________    PROCEDURES  Procedure(s) performed:    .Foreign Body Removal Date/Time: 07/11/2018 7:50 PM Performed by: Enid DerryWagner, Mohan Erven, PA-C Authorized by: Enid DerryWagner, Trevis Eden, PA-C  Consent: Verbal consent obtained. Risks and benefits: risks, benefits and alternatives were discussed Consent given  by: patient Patient understanding: patient states understanding of the procedure being performed Body area: eye Location details: right conjunctiva  Sedation: Patient sedated: no  Patient restrained: no Patient cooperative: yes Localization method: visualized Removal mechanism: 25-gauge needle Eye not examined with fluorescein. Corneal abrasion size: small Corneal abrasion location: lateral Residual rust ring present. Dressing: antibiotic drops Depth: embedded 1 objects recovered. Objects recovered: metal Post-procedure assessment: foreign body removed Patient tolerance: Patient tolerated the procedure well with no immediate complications      Medications  tetracaine (PONTOCAINE) 0.5 % ophthalmic solution 2 drop (2 drops Right Eye Given by Other 07/11/18 1817)     ____________________________________________   INITIAL IMPRESSION / ASSESSMENT AND PLAN / ED COURSE  Pertinent labs & imaging results that were available during my care of the patient were reviewed by me and considered in my medical decision making (see chart for details).  Review of  the [place] CSRS was performed in accordance of the [facility] prior to dispensing any controlled drugs.     Patient presented to emergency department for evaluation of metal foreign body to right eye.  Vital signs and exam are reassuring.  Metal piece was removed in the emergency department.  Patient still has a rust ring.  Patient is understanding that he will need to follow-up with ophthalmology to have this drilled out.  Patient will be discharged home with prescriptions for ciprofloxacin.  He will call tomorrow for an appointment.  Patient is to follow up with ophthalmology as directed. Patient is given ED precautions to return to the ED for any worsening or new symptoms.     ____________________________________________  FINAL CLINICAL IMPRESSION(S) / ED DIAGNOSES  Final diagnoses:  Foreign body of left eye, initial encounter   Corneal rust ring of left eye      NEW MEDICATIONS STARTED DURING THIS VISIT:  ED Discharge Orders         Ordered    ciprofloxacin (CILOXAN) 0.3 % ophthalmic solution  Every 4 hours while awake     07/11/18 1804    ketorolac (ACULAR) 0.4 % SOLN  3 times daily PRN,   Status:  Discontinued     07/11/18 1804    ketorolac (ACULAR) 0.4 % SOLN  3 times daily PRN     07/11/18 1805              This chart was dictated using voice recognition software/Dragon. Despite best efforts to proofread, errors can occur which can change the meaning. Any change was purely unintentional.    Enid Derry, PA-C 07/11/18 Ignacia Marvel, MD 07/11/18 2053

## 2018-07-11 NOTE — ED Notes (Signed)
Visual acuity L eye 20/25 R eye 20/30 

## 2018-07-11 NOTE — ED Triage Notes (Signed)
Pt got piece of metal in right eye on Friday, has attempted to flush it out with saline and qtip with no successful. Redness and irritation to right eye. Reports decreased vision of eye.

## 2018-10-31 ENCOUNTER — Ambulatory Visit
Admission: RE | Admit: 2018-10-31 | Discharge: 2018-10-31 | Disposition: A | Discharge status: Home / Self Care | Payer: Self-pay | Source: Ambulatory Visit | Attending: Urology | Admitting: Urology

## 2018-10-31 ENCOUNTER — Telehealth: Payer: Self-pay | Admitting: Urology

## 2018-10-31 ENCOUNTER — Other Ambulatory Visit: Payer: Self-pay

## 2018-10-31 ENCOUNTER — Other Ambulatory Visit
Admission: RE | Admit: 2018-10-31 | Discharge: 2018-10-31 | Disposition: A | Discharge status: Home / Self Care | Payer: Self-pay | Source: Ambulatory Visit | Attending: Urology | Admitting: Urology

## 2018-10-31 DIAGNOSIS — R109 Unspecified abdominal pain: Secondary | ICD-10-CM

## 2018-10-31 LAB — URINALYSIS, COMPLETE (UACMP) WITH MICROSCOPIC
Bacteria, UA: NONE SEEN
Bilirubin Urine: NEGATIVE
Glucose, UA: NEGATIVE mg/dL
Hgb urine dipstick: NEGATIVE
Ketones, ur: NEGATIVE mg/dL
Nitrite: NEGATIVE
Protein, ur: NEGATIVE mg/dL
Specific Gravity, Urine: 1.014 (ref 1.005–1.030)
Squamous Epithelial / HPF: NONE SEEN (ref 0–5)
pH: 6 (ref 5.0–8.0)

## 2018-10-31 NOTE — Telephone Encounter (Signed)
Patient's wife called stating that he has been having pain now for about three weeks. Not sure what it is from, not sure if it is a stone or scar tissue from his previous surgeries. Not really wanting to come in but not sure what he should do? He was last seen in 2018 Please advise   Marcelino Duster

## 2018-10-31 NOTE — Telephone Encounter (Signed)
Pt last seen 2018. Called pt he states that he is having back pain that starts mid back and radiates to the lower back. Pt states that this episode started with RT testicle pain which was initially a sharp pain but has since dulled but is still present. Pt denies hematuria or dysuria. Please advise. Thanks

## 2018-10-31 NOTE — Telephone Encounter (Signed)
Please schedule virtual visit with me tomorrow to discuss.   I would be helpful if he got a KUB/ urinalyisis but if he adamantly does not want to have one before our appointment, that is okay too.  I will put in the order in case he wants to stop by and get one which will help rule out stones.  Vanna Scotland, MD

## 2018-10-31 NOTE — Addendum Note (Signed)
Addended by: Vanna Scotland on: 10/31/2018 02:10 PM   Modules accepted: Orders

## 2018-11-01 ENCOUNTER — Other Ambulatory Visit: Payer: Self-pay

## 2018-11-01 ENCOUNTER — Telehealth (INDEPENDENT_AMBULATORY_CARE_PROVIDER_SITE_OTHER): Payer: Self-pay | Admitting: Urology

## 2018-11-01 DIAGNOSIS — N50812 Left testicular pain: Secondary | ICD-10-CM

## 2018-11-01 DIAGNOSIS — Z87442 Personal history of urinary calculi: Secondary | ICD-10-CM

## 2018-11-01 MED ORDER — SULFAMETHOXAZOLE-TRIMETHOPRIM 800-160 MG PO TABS: 1.0000 | ORAL_TABLET | Freq: Two times a day (BID) | ORAL | 0 refills | Status: DC

## 2018-11-07 NOTE — Progress Notes (Signed)
Virtual Visit via Video Note  I connected with Lance Garcia on 11/01/2018 at  2:30 PM EDT by a video enabled telemedicine application and verified that I am speaking with the correct person using two identifiers.   I discussed the limitations of evaluation and management by telemedicine and the availability of in person appointments. The patient expressed understanding and agreed to proceed.  History of Present Illness: 33 year old male with a personal history of kidney stones who presents today for evaluation via virtual visit with 3 weeks of flank pain concerning for stone episode.  He called the office yesterday complaining of flank pain and a personal history of kidney stones.  He had no associated urinary symptoms.  In discussing symptoms further today, he reports dull lower back pain, as well as left scrotal pain which comes and goes.  It is exacerbated with physical activity.  He denies any associated urinary symptoms including no urgency, frequency, dysuria or gross hematuria.  He denies any penile discharge.  On self-exam, he does note tenderness primarily just above his left testicle.  As part of his evaluation, he did drop off a urine sample yesterday which was completely negative.  He also had a KUB which showed no evidence of renal calculi.   This x-ray was personally reviewed and agreed with radiologic interpretation.   Observations/Objective: Appears calm, in no acute distress on video chat today.  He is incidentally up on a ladder at work.  Assessment and Plan:   1. Left testicular pain Primary complaint today of left testicular pain, tender to palpation Notably, the patient has presented with stones in a similar manner, however his UA/KUB today are negative which is reassuring We discussed that this may represent an inflammatory epididymitis and in light of current COVID-19 situation, we will go ahead and treat for presumptive epididymitis although unable to examine the  patient today limiting ability to make a more definitive diagnosis Recommend supportive care, NSAIDs, as well as Bactrim x1 week If symptoms fail to resolve, will order CT scan to rule out stone or have the patient present for an in house visit (patient is very hesitant to come into the office in light of pandemic  Symptoms reviewed with patient in detail including going to the emergency room if he has any severe pain, associated fevers or chills, worsening testicular pain for ultrasound.  He advised to call our office if his symptoms fail to resolve over the next week.  2. History of kidney stones As above   Follow Up Instructions: F/u prn   I discussed the assessment and treatment plan with the patient. The patient was provided an opportunity to ask questions and all were answered. The patient agreed with the plan and demonstrated an understanding of the instructions.   The patient was advised to call back or seek an in-person evaluation if the symptoms worsen or if the condition fails to improve as anticipated.  I provided 17 minutes of non-face-to-face time during this encounter.   Vanna Scotland, MD

## 2018-12-04 ENCOUNTER — Encounter: Payer: Self-pay | Admitting: Emergency Medicine

## 2018-12-04 ENCOUNTER — Emergency Department: Payer: Self-pay

## 2018-12-04 ENCOUNTER — Emergency Department
Admission: EM | Admit: 2018-12-04 | Discharge: 2018-12-04 | Disposition: A | Discharge status: Home / Self Care | Payer: Self-pay | Attending: Emergency Medicine | Admitting: Emergency Medicine

## 2018-12-04 ENCOUNTER — Other Ambulatory Visit: Payer: Self-pay

## 2018-12-04 DIAGNOSIS — N50819 Testicular pain, unspecified: Secondary | ICD-10-CM

## 2018-12-04 DIAGNOSIS — F17210 Nicotine dependence, cigarettes, uncomplicated: Secondary | ICD-10-CM | POA: Insufficient documentation

## 2018-12-04 DIAGNOSIS — Z79899 Other long term (current) drug therapy: Secondary | ICD-10-CM | POA: Insufficient documentation

## 2018-12-04 DIAGNOSIS — R1011 Right upper quadrant pain: Secondary | ICD-10-CM

## 2018-12-04 DIAGNOSIS — N503 Cyst of epididymis: Secondary | ICD-10-CM

## 2018-12-04 DIAGNOSIS — R1010 Upper abdominal pain, unspecified: Secondary | ICD-10-CM

## 2018-12-04 LAB — CBC
HCT: 45.3 % (ref 39.0–52.0)
Hemoglobin: 15.5 g/dL (ref 13.0–17.0)
MCH: 30 pg (ref 26.0–34.0)
MCHC: 34.2 g/dL (ref 30.0–36.0)
MCV: 87.8 fL (ref 80.0–100.0)
Platelets: 300 10*3/uL (ref 150–400)
RBC: 5.16 MIL/uL (ref 4.22–5.81)
RDW: 11.8 % (ref 11.5–15.5)
WBC: 14 10*3/uL — ABNORMAL HIGH (ref 4.0–10.5)
nRBC: 0 % (ref 0.0–0.2)

## 2018-12-04 LAB — URINE DRUG SCREEN, QUALITATIVE (ARMC ONLY)
Amphetamines, Ur Screen: NOT DETECTED
Barbiturates, Ur Screen: NOT DETECTED
Benzodiazepine, Ur Scrn: NOT DETECTED
Cannabinoid 50 Ng, Ur ~~LOC~~: POSITIVE — AB
Cocaine Metabolite,Ur ~~LOC~~: NOT DETECTED
MDMA (Ecstasy)Ur Screen: NOT DETECTED
Methadone Scn, Ur: NOT DETECTED
Opiate, Ur Screen: POSITIVE — AB
Phencyclidine (PCP) Ur S: NOT DETECTED
Tricyclic, Ur Screen: NOT DETECTED

## 2018-12-04 LAB — COMPREHENSIVE METABOLIC PANEL
ALT: 18 U/L (ref 0–44)
AST: 21 U/L (ref 15–41)
Albumin: 5.1 g/dL — ABNORMAL HIGH (ref 3.5–5.0)
Alkaline Phosphatase: 83 U/L (ref 38–126)
Anion gap: 10 (ref 5–15)
BUN: 18 mg/dL (ref 6–20)
CO2: 23 mmol/L (ref 22–32)
Calcium: 9.6 mg/dL (ref 8.9–10.3)
Chloride: 105 mmol/L (ref 98–111)
Creatinine, Ser: 0.95 mg/dL (ref 0.61–1.24)
GFR calc Af Amer: >60 mL/min (ref >60)
GFR calc non Af Amer: >60 mL/min (ref >60)
Glucose, Bld: 94 mg/dL (ref 70–99)
Potassium: 4.1 mmol/L (ref 3.5–5.1)
Sodium: 138 mmol/L (ref 135–145)
Total Bilirubin: 0.8 mg/dL (ref 0.3–1.2)
Total Protein: 8.5 g/dL — ABNORMAL HIGH (ref 6.5–8.1)

## 2018-12-04 LAB — URINALYSIS, COMPLETE (UACMP) WITH MICROSCOPIC
Bacteria, UA: NONE SEEN
Bilirubin Urine: NEGATIVE
Glucose, UA: NEGATIVE mg/dL
Hgb urine dipstick: NEGATIVE
Ketones, ur: 5 mg/dL — AB
Leukocytes,Ua: NEGATIVE
Nitrite: NEGATIVE
Protein, ur: NEGATIVE mg/dL
Specific Gravity, Urine: 1.011 (ref 1.005–1.030)
Squamous Epithelial / HPF: NONE SEEN (ref 0–5)
WBC, UA: NONE SEEN WBC/hpf (ref 0–5)
pH: 5 (ref 5.0–8.0)

## 2018-12-04 LAB — LIPASE, BLOOD: Lipase: 22 U/L (ref 11–51)

## 2018-12-04 MED ORDER — SODIUM CHLORIDE 0.9 % IV BOLUS
1000.0000 mL | Freq: Once | INTRAVENOUS | Status: AC
  Administered 2018-12-04: 12:00:00 1000 mL via INTRAVENOUS

## 2018-12-04 MED ORDER — ONDANSETRON HCL 4 MG/2ML IJ SOLN
4.0000 mg | Freq: Once | INTRAMUSCULAR | Status: AC
  Administered 2018-12-04: 12:00:00 4 mg via INTRAVENOUS
  Filled 2018-12-04: qty 2

## 2018-12-04 MED ORDER — PANTOPRAZOLE SODIUM 20 MG PO TBEC: 20.0000 mg | DELAYED_RELEASE_TABLET | Freq: Every day | ORAL | 0 refills | Status: DC

## 2018-12-04 MED ORDER — SODIUM CHLORIDE 0.9% FLUSH: 3.0000 mL | Freq: Once | INTRAVENOUS | Status: DC

## 2018-12-04 MED ORDER — MORPHINE SULFATE (PF) 4 MG/ML IV SOLN
4.0000 mg | Freq: Once | INTRAVENOUS | Status: AC
  Administered 2018-12-04: 4 mg via INTRAVENOUS
  Filled 2018-12-04: qty 1

## 2018-12-04 MED ORDER — IOHEXOL 300 MG/ML  SOLN
75.0000 mL | Freq: Once | INTRAMUSCULAR | Status: AC | PRN
  Administered 2018-12-04: 14:00:00 75 mL via INTRAVENOUS

## 2018-12-04 NOTE — ED Triage Notes (Signed)
Pt to ED via POV c/o epigastric abdominal pain that radiates into the left side of his back. Pt state that the pain started yesterday but has gotten worse. Pt denies N/V. Pt states that he does have diarrhea. This morning pt states that he was having a BM and he had a shooting pain in his testicle. Pt states that lying down makes the pain worse. Pt is in NAD.

## 2018-12-04 NOTE — ED Notes (Signed)
Signature pad not working. Hard copy printed and signed by pt.  

## 2018-12-04 NOTE — Discharge Instructions (Addendum)
Follow-up with your regular doctor or Dr. Allegra Lai who is a GI specialist.  Return to emergency department if worsening.  You also need to follow-up with a urologist due to the epididymal cyst that was noted on your ultrasound.

## 2018-12-04 NOTE — ED Provider Notes (Addendum)
[facility] Emergency Department Provider Note  ____________________________________________   First MD Initiated Contact with Patient 12/04/18 1202     (approximate)  I have reviewed the triage vital signs and the nursing notes.   HISTORY  Chief Complaint Abdominal Pain    HPI Lance Garcia is a 33 y.o. male presents emergency department complaint of epigastric pain in the right and left upper quadrants that radiates into the right side of his back.  Patient states abdominal pain started yesterday but is gotten worse.  He denies any nausea or vomiting but does state that the pain gets worse with eating.  He has had several episodes of diarrhea.  He has had testicular pain on and off for 2 years.  He states that his doctor did an x-ray but not an ultrasound.  He has had a 30 pound weight loss in the past 2 months.  He denies fever chills.  Denies chest pain or shortness of breath.    Past Medical History:  Diagnosis Date  . Anxiety   . Family history of adverse reaction to anesthesia    PTS MOM-N/V  . Headache    h/o migraines  . History of kidney stones     There are no active problems to display for this patient.   Past Surgical History:  Procedure Laterality Date  . CYSTOSCOPY WITH STENT PLACEMENT Left 08/03/2016   Procedure: CYSTOSCOPY WITH STENT PLACEMENT;  Surgeon: Vanna Scotland, MD;  Location: [facility] ORS;  Service: Urology;  Laterality: Left;  . KIDNEY STONE SURGERY    . URETEROSCOPY WITH HOLMIUM LASER LITHOTRIPSY Left 08/03/2016   Procedure: URETEROSCOPY WITH HOLMIUM LASER LITHOTRIPSY;  Surgeon: Vanna Scotland, MD;  Location: [facility] ORS;  Service: Urology;  Laterality: Left;    Prior to Admission medications   Medication Sig Start Date End Date Taking? Authorizing Provider  ibuprofen (ADVIL) 200 MG tablet Take 200 mg by mouth every 6 (six) hours as needed.   Yes [provider]  pantoprazole (PROTONIX) 20 MG tablet Take 1 tablet  (20 mg total) by mouth daily. 12/04/18   Faythe Ghee, PA-C    Allergies Patient has no known allergies.  No family history on file.  Social History Social History   Tobacco Use  . Smoking status: Current Every Day Smoker    Packs/day: 1.00    Years: 15.00    Pack years: 15.00    Types: Cigarettes  . Smokeless tobacco: Never Used  Substance Use Topics  . Alcohol use: No  . Drug use: No    Review of Systems  Constitutional: No fever/chills Eyes: No visual changes. ENT: No sore throat. Respiratory: Denies cough Gastrointestinal: Positive for right upper quadrant and epigastric pain radiates to the back Genitourinary: Negative for dysuria.  Positive for testicular pain Musculoskeletal: Negative for back pain. Skin: Negative for rash.    ____________________________________________   PHYSICAL EXAM:  VITAL SIGNS: ED Triage Vitals  Enc Vitals Group     BP 12/04/18 1111 126/80     Pulse Rate 12/04/18 1111 (!) 112     Resp 12/04/18 1111 18     Temp 12/04/18 1111 98.1 F (36.7 C)     Temp Source 12/04/18 1111 Oral     SpO2 12/04/18 1111 100 %     Weight 12/04/18 1112 125 lb (56.7 kg)     Height 12/04/18 1112 5\' 8"  (1.727 m)     Head Circumference --      Peak Flow --  Pain Score 12/04/18 1112 8     Pain Loc --      Pain Edu? --      Excl. in GC? --     Constitutional: Alert and oriented. Well appearing and in no acute distress. Eyes: Conjunctivae are normal.  Head: Atraumatic. Nose: No congestion/rhinnorhea. Mouth/Throat: Mucous membranes are moist.   Neck:  supple no lymphadenopathy noted Cardiovascular: Cardiac, regular rhythm. Heart sounds are normal Respiratory: Normal respiratory effort.  No retractions, lungs c t a  Abd: soft tender right upper quadrant and midepigastric, Bs normal all 4 quad GU: deferred Musculoskeletal: FROM all extremities, warm and well perfused Neurologic:  Normal speech and language.  Skin:  Skin is warm, dry and  intact. No rash noted. Psychiatric: Mood and affect are normal. Speech and behavior are normal.  ____________________________________________   LABS (all labs ordered are listed, but only abnormal results are displayed)  Labs Reviewed  COMPREHENSIVE METABOLIC PANEL - Abnormal; Notable for the following components:      Result Value   Total Protein 8.5 (*)    Albumin 5.1 (*)    All other components within normal limits  CBC - Abnormal; Notable for the following components:   WBC 14.0 (*)    All other components within normal limits  URINALYSIS, COMPLETE (UACMP) WITH MICROSCOPIC - Abnormal; Notable for the following components:   Color, Urine YELLOW (*)    APPearance CLEAR (*)    Ketones, ur 5 (*)    All other components within normal limits  URINE DRUG SCREEN, QUALITATIVE ([facility] ONLY) - Abnormal; Notable for the following components:   Opiate, Ur Screen POSITIVE (*)    Cannabinoid 50 Ng, Ur [place] POSITIVE (*)    All other components within normal limits  LIPASE, BLOOD   ____________________________________________   ____________________________________________  RADIOLOGY  Ultrasound right upper quadrant and scrotum Ultrasound RUQ is negative Ultrasound scrotum shows epididymal cyst  ____________________________________________   PROCEDURES  Procedure(s) performed: Saline lock, morphine 4 mg IV, Frank 4 mg IV,  saline 1 L IV  Procedures    ____________________________________________   INITIAL IMPRESSION / ASSESSMENT AND PLAN / ED COURSE  Pertinent labs & imaging results that were available during my care of the patient were reviewed by me and considered in my medical decision making (see chart for details).   Patient is 33 year old male presents emergency department with complaints of abdominal pain that radiates into the back.  Pain is located right upper quadrant midepigastric area.  Symptoms started yesterday.  Patient had a 30 pound weight loss and to months.   He is also had chronic testicular pain which he is concerned as his doctors to the x-ray but never did a ultrasound.  Physical exam patient appears slightly uncomfortable.  The right upper quadrant and midepigastric area very tender to palpation.  Remainder the exam is basically unremarkable  DDX: abdominal pain, cholelithiasis, pancreatitis, testicular cancer  Comprehensive metabolic panel is basically normal, CBC has elevated WBC at 14, lipase is normal, UA still pending   UA is normal, UDS shows opiates and cannabinoids  Ultrasound of the right upper quadrant is negative for any gallstones Ultrasound of scrotum shows epididymal cyst CT of the abdomen and pelvis is normal  As part of my medical decision making, I reviewed the following data within the electronic MEDICAL RECORD NUMBER Nursing notes reviewed and incorporated, Labs reviewed see above, Old chart reviewed, Radiograph reviewed see above, Evaluated by EM attending Dr. Marisa Severin, Notes from prior ED  visits and [place] Controlled Substance Database  ____________________________________________   FINAL CLINICAL IMPRESSION(S) / ED DIAGNOSES  Final diagnoses:  RUQ pain  Pain of upper abdomen  Epididymal cyst      NEW MEDICATIONS STARTED DURING THIS VISIT:  New Prescriptions   PANTOPRAZOLE (PROTONIX) 20 MG TABLET    Take 1 tablet (20 mg total) by mouth daily.     Note:  This document was prepared using Dragon voice recognition software and may include unintentional dictation errors.    Faythe GheeFisher, Susan W, PA-C 12/04/18 1506    Sherrie MustacheFisher, Roselyn BeringSusan W, PA-C 12/04/18 1507    Dionne BucySiadecki, Sebastian, MD 12/04/18 [phone]

## 2018-12-05 ENCOUNTER — Encounter: Payer: Self-pay | Admitting: Urology

## 2018-12-06 ENCOUNTER — Telehealth: Payer: Self-pay | Admitting: Urology

## 2018-12-06 NOTE — Telephone Encounter (Signed)
Appointment is made.

## 2018-12-06 NOTE — Telephone Encounter (Signed)
Patient's wife called and stated that he was seen in the ED for pain and they did an [place] and found a cyst that is causing him pain and was told to call the office to see if he could be seen to have it drained? Can someone please take a look at his chart and call him back and let him know if he needs to be seen?   Marcelino Duster

## 2018-12-12 ENCOUNTER — Emergency Department
Admission: EM | Admit: 2018-12-12 | Discharge: 2018-12-12 | Disposition: A | Discharge status: Home / Self Care | Payer: Self-pay | Attending: Emergency Medicine | Admitting: Emergency Medicine

## 2018-12-12 ENCOUNTER — Encounter: Payer: Self-pay | Admitting: Emergency Medicine

## 2018-12-12 ENCOUNTER — Other Ambulatory Visit: Payer: Self-pay

## 2018-12-12 DIAGNOSIS — G8929 Other chronic pain: Secondary | ICD-10-CM | POA: Insufficient documentation

## 2018-12-12 DIAGNOSIS — R1013 Epigastric pain: Secondary | ICD-10-CM | POA: Insufficient documentation

## 2018-12-12 DIAGNOSIS — F1210 Cannabis abuse, uncomplicated: Secondary | ICD-10-CM | POA: Insufficient documentation

## 2018-12-12 DIAGNOSIS — R1012 Left upper quadrant pain: Secondary | ICD-10-CM | POA: Insufficient documentation

## 2018-12-12 DIAGNOSIS — F17210 Nicotine dependence, cigarettes, uncomplicated: Secondary | ICD-10-CM | POA: Insufficient documentation

## 2018-12-12 LAB — COMPREHENSIVE METABOLIC PANEL
ALT: 18 U/L (ref 0–44)
AST: 22 U/L (ref 15–41)
Albumin: 4.7 g/dL (ref 3.5–5.0)
Alkaline Phosphatase: 83 U/L (ref 38–126)
Anion gap: 9 (ref 5–15)
BUN: 11 mg/dL (ref 6–20)
CO2: 24 mmol/L (ref 22–32)
Calcium: 9.4 mg/dL (ref 8.9–10.3)
Chloride: 105 mmol/L (ref 98–111)
Creatinine, Ser: 0.76 mg/dL (ref 0.61–1.24)
GFR calc Af Amer: >60 mL/min (ref >60)
GFR calc non Af Amer: >60 mL/min (ref >60)
Glucose, Bld: 99 mg/dL (ref 70–99)
Potassium: 4 mmol/L (ref 3.5–5.1)
Sodium: 138 mmol/L (ref 135–145)
Total Bilirubin: 0.6 mg/dL (ref 0.3–1.2)
Total Protein: 7.4 g/dL (ref 6.5–8.1)

## 2018-12-12 LAB — CBC
HCT: 42 % (ref 39.0–52.0)
Hemoglobin: 14.2 g/dL (ref 13.0–17.0)
MCH: 29.7 pg (ref 26.0–34.0)
MCHC: 33.8 g/dL (ref 30.0–36.0)
MCV: 87.9 fL (ref 80.0–100.0)
Platelets: 281 10*3/uL (ref 150–400)
RBC: 4.78 MIL/uL (ref 4.22–5.81)
RDW: 12 % (ref 11.5–15.5)
WBC: 12.3 10*3/uL — ABNORMAL HIGH (ref 4.0–10.5)
nRBC: 0 % (ref 0.0–0.2)

## 2018-12-12 LAB — LIPASE, BLOOD: Lipase: 25 U/L (ref 11–51)

## 2018-12-12 MED ORDER — DICYCLOMINE HCL 10 MG PO CAPS: 10.0000 mg | ORAL_CAPSULE | Freq: Three times a day (TID) | ORAL | 0 refills | Status: DC | PRN

## 2018-12-12 NOTE — Discharge Instructions (Signed)
You have been seen in the Emergency Department (ED) for abdominal pain.  Your evaluation did not identify a clear cause of your symptoms but was generally reassuring.  Please follow up as instructed above regarding today's emergent visit and the symptoms that are bothering you.  As we discussed, we cannot provide narcotics for chronic pain according to the [place] [facility] policy.  Return to the ED if your abdominal pain worsens or fails to improve, you develop bloody vomiting, bloody diarrhea, you are unable to tolerate fluids due to vomiting, fever greater than 101, or other symptoms that concern you.  

## 2018-12-12 NOTE — ED Provider Notes (Signed)
[facility] Emergency Department Provider Note  ____________________________________________   First MD Initiated Contact with Patient 12/12/18 0559     (approximate)  I have reviewed the triage vital signs and the nursing notes.   HISTORY  Chief Complaint Abdominal Pain    HPI Lance Garcia is a 33 y.o. male with medical history as listed below which notably includes chronic epigastric and abdominal pain.  He presents for evaluation of persistent pain tonight in the left upper quadrant that radiates through to his back.  He is been having this pain for at least a week but says he has had abdominal pain for years.  He has had multiple recent ED visits; this is his third visit within  the last 6 months and the second visit within a week.  He has a follow-up appointment in 1 week with GI, but he said that he woke up this morning feeling worse pain and so he thought he should come in.  He denies fever/chills, sore throat, chest pain, shortness of breath, nausea, vomiting.  He said the pain radiates down into his testicles and this is been the case for an extended period of time.  Nothing in particular makes the symptoms better or worse.  He has had no recent injury.        Past Medical History:  Diagnosis Date  . Anxiety   . Family history of adverse reaction to anesthesia    PTS MOM-N/V  . Headache    h/o migraines  . History of kidney stones     There are no active problems to display for this patient.   Past Surgical History:  Procedure Laterality Date  . CYSTOSCOPY WITH STENT PLACEMENT Left 08/03/2016   Procedure: CYSTOSCOPY WITH STENT PLACEMENT;  Surgeon: Vanna ScotlandAshley Brandon, MD;  Location: [facility] ORS;  Service: Urology;  Laterality: Left;  . KIDNEY STONE SURGERY    . URETEROSCOPY WITH HOLMIUM LASER LITHOTRIPSY Left 08/03/2016   Procedure: URETEROSCOPY WITH HOLMIUM LASER LITHOTRIPSY;  Surgeon: Vanna ScotlandAshley Brandon, MD;  Location: [facility] ORS;  Service: Urology;   Laterality: Left;    Prior to Admission medications   Medication Sig Start Date End Date Taking? Authorizing Provider  pantoprazole (PROTONIX) 20 MG tablet Take 1 tablet (20 mg total) by mouth daily. 12/04/18  Yes Fisher, Roselyn BeringSusan W, PA-C  dicyclomine (BENTYL) 10 MG capsule Take 1 capsule (10 mg total) by mouth 3 (three) times daily as needed for up to 14 days for spasms. or abdominal pain 12/12/18 12/26/18  Loleta RoseForbach, Dorethea Strubel, MD    Allergies Patient has no known allergies.  History reviewed. No pertinent family history.  Social History Social History   Tobacco Use  . Smoking status: Current Every Day Smoker    Packs/day: 1.00    Years: 15.00    Pack years: 15.00    Types: Cigarettes  . Smokeless tobacco: Never Used  Substance Use Topics  . Alcohol use: No  . Drug use: Yes    Types: Marijuana    Comment: used in the last week    Review of Systems Constitutional: No fever/chills Eyes: No visual changes. ENT: No sore throat. Cardiovascular: Denies chest pain. Respiratory: Denies shortness of breath. Gastrointestinal: Abdominal pain as described above.  No nausea nor vomiting. Genitourinary: Abdominal pain radiating into the scrotum as described above.  Negative for dysuria. Musculoskeletal: Negative for neck pain.  Negative for back pain. Integumentary: Negative for rash. Neurological: Negative for headaches, focal weakness or numbness.   ____________________________________________  PHYSICAL EXAM:  VITAL SIGNS: ED Triage Vitals  Enc Vitals Group     BP 12/12/18 0343 (!) 113/94     Pulse Rate 12/12/18 0343 71     Resp 12/12/18 0343 18     Temp 12/12/18 0343 98 F (36.7 C)     Temp Source 12/12/18 0343 Oral     SpO2 12/12/18 0343 100 %     Weight 12/12/18 0343 56.7 kg (125 lb)     Height 12/12/18 0343 1.727 m (5\' 8" )     Head Circumference --      Peak Flow --      Pain Score 12/12/18 0404 8     Pain Loc --      Pain Edu? --      Excl. in GC? --      Constitutional: Alert and oriented. Well appearing and in no acute distress. Eyes: Conjunctivae are normal.  Head: Atraumatic. Nose: No congestion/rhinnorhea. Mouth/Throat: Mucous membranes are moist. Neck: No stridor.  No meningeal signs.   Cardiovascular: Normal rate, regular rhythm. Good peripheral circulation. Grossly normal heart sounds. Respiratory: Normal respiratory effort.  No retractions. No audible wheezing. Gastrointestinal: Soft and nontender. No distention.  The patient was talking while I palpated his abdomen and he did not have any apparent tenderness. Musculoskeletal: No lower extremity tenderness nor edema. No gross deformities of extremities. Neurologic:  Normal speech and language. No gross focal neurologic deficits are appreciated.  Skin:  Skin is warm, dry and intact. No rash noted. Psychiatric: Mood and affect are normal. Speech and behavior are normal.  ____________________________________________   LABS (all labs ordered are listed, but only abnormal results are displayed)  Labs Reviewed  CBC - Abnormal; Notable for the following components:      Result Value   WBC 12.3 (*)    All other components within normal limits  LIPASE, BLOOD  COMPREHENSIVE METABOLIC PANEL  URINALYSIS, COMPLETE (UACMP) WITH MICROSCOPIC   ____________________________________________  EKG  None - EKG not ordered by ED physician ____________________________________________  RADIOLOGY   ED MD interpretation: No indication for imaging tonight.  Official radiology report(s): No results found.  ____________________________________________   PROCEDURES   Procedure(s) performed (including Critical Care):  Procedures   ____________________________________________   INITIAL IMPRESSION / MDM / ASSESSMENT AND PLAN / ED COURSE  As part of my medical decision making, I reviewed the following data within the electronic MEDICAL RECORD NUMBER Nursing notes reviewed and  incorporated, Labs reviewed , Old chart reviewed, Notes from prior ED visits and [place] Controlled Substance Database      *TAGGERT CONSTANTINIDES was evaluated in Emergency Department on 12/12/2018 for the symptoms described in the history of present illness. He was evaluated in the context of the global COVID-19 pandemic, which necessitated consideration that the patient might be at risk for infection with the SARS-CoV-2 virus that causes COVID-19. Institutional protocols and algorithms that pertain to the evaluation of patients at risk for COVID-19 are in a state of rapid change based on information released by regulatory bodies including the CDC and federal and state organizations. These policies and algorithms were followed during the patient's care in the ED.  Some ED evaluations and interventions may be delayed as a result of limited staffing during the pandemic.*  Differential diagnosis includes, but is not limited to, acute on chronic abdominal pain, IBD, IBS, acute infection.  The patient has been worked up extensively recently including ultrasounds of the right upper quadrant and the scrotum, CT  scan, repeat lab work, etc.  He had a telemedicine visit with urology and has a telemedicine visit scheduled with gastroenterology in 1 week.  He is in no apparent distress at this time, stable lab work, decreasing leukocytosis, otherwise unremarkable lab work.  I explained that I do not have anything else to offer from an emergency department work-up perspective.  He is taking pantoprazole and I told him I would prescribe him Bentyl which I sent electronically to [facility] pharmacy.  I encouraged him to take his regular medicines and over-the-counter medicine and follow-up with GI as planned.  I gave my usual customary return precautions and he understands and agrees with the plan.      ____________________________________________  FINAL CLINICAL IMPRESSION(S) / ED DIAGNOSES  Final diagnoses:  Chronic  epigastric pain     MEDICATIONS GIVEN DURING THIS VISIT:  Medications - No data to display   ED Discharge Orders         Ordered    dicyclomine (BENTYL) 10 MG capsule  3 times daily PRN     12/12/18 1610           Note:  This document was prepared using Dragon voice recognition software and may include unintentional dictation errors.   Loleta Rose, MD 12/12/18 [phone]

## 2018-12-12 NOTE — ED Triage Notes (Signed)
Pt has arrived with pain from epigastric area to left upper quad and through to his back; pt says he's had pain since he was seen here last weekend for same; pain has been intermittent all week but not as bad as what woke him from sleep this am; denies N/V/D; pt has appointments with Laurette Schimke and urology in the next 1-2 weeks

## 2018-12-13 ENCOUNTER — Encounter: Payer: Self-pay | Admitting: Gastroenterology

## 2018-12-13 ENCOUNTER — Ambulatory Visit: Payer: Self-pay | Admitting: Gastroenterology

## 2018-12-13 ENCOUNTER — Other Ambulatory Visit: Payer: Self-pay

## 2018-12-13 VITALS — BP 128/71 | HR 87 | Temp 98.4°F | Ht 68.0 in | Wt 128.2 lb

## 2018-12-13 DIAGNOSIS — R1084 Generalized abdominal pain: Secondary | ICD-10-CM

## 2018-12-13 DIAGNOSIS — R634 Abnormal weight loss: Secondary | ICD-10-CM

## 2018-12-13 NOTE — Progress Notes (Signed)
  Gastroenterology Consultation  Referring Provider:     Forbach, Cory, MD Primary Care Physician:  Patient, No Pcp Per Primary Gastroenterologist:  Dr. Allea Kassner     Reason for Consultation:     Abdominal pain        HPI:   Lance Garcia is a 32 y.o. y/o male referred for consultation & management of Abdominal pain by Dr. Patient, No Pcp Per.  This patient comes in today after being seen in emergency room for abdominal pain. The patient was seen on May 24 and then again yesterday in the ER for the abdominal pain.  The patient comes with his fiance who reports that the abdominal pain has been present for many years.  The patient states the abdominal pain is so severe that he hasn't been able to eat and has lost 21 pounds in the last 2 months.  He also reports that his symptoms are worse when he lifts things or moves around.  He has not been able to lift things at work because the abdominal pain.  There is no report of any black stools or bloody stools but the patient's fiance states that he has had some bright red blood on the toilet paper the past.  The patient also reports that his mother had a history of polyps with colonoscopies every 3-5 years starting in her 30s because the polyps.  The patient has never had a colonoscopy.  He denies any family history of colon cancer or inflammatory bowel disease.  There are no reports of any joint pain associated with his abdominal pain.  His abdominal pain is throughout his whole abdomen but mostly centered under the ribs bilaterally and on the right flank.  He also reports that the abdominal pain goes into his back.  The patient states that when he goes to move his bowels if he does any straining he has pain in his abdomen back and radiates down to his testicles.  He is reporting that his testicular pain is being evaluated by urology.  He also reports that his testicles are swollen. The patient's fiance also reports that the patient suffers from anxiety.   Past Medical History:  Diagnosis Date  . Anxiety   . Family history of adverse reaction to anesthesia    PTS MOM-N/V  . Headache    h/o migraines  . History of kidney stones     Past Surgical History:  Procedure Laterality Date  . CYSTOSCOPY WITH STENT PLACEMENT Left 08/03/2016   Procedure: CYSTOSCOPY WITH STENT PLACEMENT;  Surgeon: Ashley Brandon, MD;  Location: [facility];  Service: Urology;  Laterality: Left;  . KIDNEY STONE SURGERY    . URETEROSCOPY WITH HOLMIUM LASER LITHOTRIPSY Left 08/03/2016   Procedure: URETEROSCOPY WITH HOLMIUM LASER LITHOTRIPSY;  Surgeon: Ashley Brandon, MD;  Location: [facility];  Service: Urology;  Laterality: Left;    Prior to Admission medications   Medication Sig Start Date End Date Taking? Authorizing Provider  acetaminophen (TYLENOL) 500 MG tablet Take 500 mg by mouth every 6 (six) hours as needed.   Yes [provider]  dicyclomine (BENTYL) 10 MG capsule Take 1 capsule (10 mg total) by mouth 3 (three) times daily as needed for up to 14 days for spasms. or abdominal pain 12/12/18 12/26/18 Yes Forbach, Cory, MD  pantoprazole (PROTONIX) 20 MG tablet Take 1 tablet (20 mg total) by mouth daily. 12/04/18  Yes Fisher, Susan W, PA-C    Family History  Problem Relation Age of Onset  .   Neuropathy Mother   . Colon polyps Mother   . Diabetes Mother   . Throat cancer Father      Social History   Tobacco Use  . Smoking status: Current Every Day Smoker    Packs/day: 1.00    Years: 15.00    Pack years: 15.00    Types: Cigarettes  . Smokeless tobacco: Never Used  Substance Use Topics  . Alcohol use: No  . Drug use: Yes    Types: Marijuana    Comment: used in the last week    Allergies as of 12/13/2018  . (No Known Allergies)    Review of Systems:    All systems reviewed and negative except where noted in HPI.   Physical Exam:  BP 128/71   Pulse 87   Temp 98.4 F (36.9 C) (Oral)   Ht 5\' 8"  (1.727 m)   Wt 128 lb 3.2 oz (58.2 kg)   BMI  19.49 kg/m  No LMP for male patient. General:   Alert,  Well-developed, well-nourished, pleasant and cooperative in NAD Head:  Normocephalic and atraumatic. Eyes:  Sclera clear, no icterus.   Conjunctiva pink. Ears:  Normal auditory acuity. Neck:  Supple; no masses or thyromegaly. Lungs:  Respirations even and unlabored.  Clear throughout to auscultation.   No wheezes, crackles, or rhonchi. No acute distress. Heart:  Regular rate and rhythm; no murmurs, clicks, rubs, or gallops. Abdomen:  Normal bowel sounds.  No bruits.  Soft, Abdominal wall tenderness with reproducing of the abdominal tenderness with 1 finger palpation while flexing the abdominal wall muscles.  Positive Carnett sign.  There is no rebound or guarding. Rectal:  Deferred.  Msk:  Symmetrical without gross deformities.  Good, equal movement & strength bilaterally. Pulses:  Normal pulses noted. Extremities:  No clubbing or edema.  No cyanosis. Neurologic:  Alert and oriented x3;  grossly normal neurologically. Skin:  Intact without significant lesions or rashes.  No jaundice. Lymph Nodes:  No significant cervical adenopathy. Psych:  Alert and cooperative. Normal mood and affect.  Imaging Studies: Ct Abdomen Pelvis W Contrast  Result Date: 12/04/2018 CLINICAL DATA:  Epigastric region pain radiating toward left side EXAM: CT ABDOMEN AND PELVIS WITH CONTRAST TECHNIQUE: Multidetector CT imaging of the abdomen and pelvis was performed using the standard protocol following bolus administration of intravenous contrast. CONTRAST:  75mL OMNIPAQUE IOHEXOL 300 MG/ML  SOLN COMPARISON:  June 22, 2016 FINDINGS: Lower chest: Lung bases are clear. Hepatobiliary: There are scattered subcentimeter apparent cysts throughout the liver. No noncystic liver lesions are evident. Gallbladder wall is not appreciably thickened. There is no biliary duct dilatation. Pancreas: There is no pancreatic mass or inflammatory focus. Spleen: No splenic lesions  are evident. Adrenals/Urinary Tract: Adrenals bilaterally appear unremarkable. Kidneys bilaterally show no evident mass or hydronephrosis on either side. There is an extrarenal pelvis on the left, an anatomic variant. There is no evident renal or ureteral calculus on either side. Urinary bladder is midline with wall thickness within normal limits. Stomach/Bowel: There is no appreciable bowel wall or mesenteric thickening. There is no evident bowel obstruction. Terminal ileum appears unremarkable. There is no evident free air or portal venous air. Vascular/Lymphatic: There is no abdominal aortic aneurysm. No appreciable vascular lesions are evident. No adenopathy is evident in the abdomen or pelvis. Reproductive: Prostate and seminal vesicles are normal in size and contour. No evident pelvic mass. Other: Appendix appears normal. There is no evident abscess or ascites in the abdomen or pelvis. Musculoskeletal: There  are no blastic or lytic bone lesions. There is no intramuscular or abdominal wall lesion. IMPRESSION: 1. A cause for patient's symptoms has not been established with this study. 2. No appreciable renal or ureteral calculus. No hydronephrosis. Urinary bladder wall thickness is within normal limits. 3. No bowel obstruction or bowel wall thickening. No abscess in the abdomen or pelvis. Appendix appears normal. Electronically Signed   By: Bretta Bang III M.D.   On: 12/04/2018 14:13   US Scrotum W/doppler  Result Date: 12/04/2018 CLINICAL DATA:  Left testicular pain for 2-3 weeks. EXAM: SCROTAL ULTRASOUND DOPPLER ULTRASOUND OF THE TESTICLES TECHNIQUE: Complete ultrasound examination of the testicles, epididymis, and other scrotal structures was performed. Color and spectral Doppler ultrasound were also utilized to evaluate blood flow to the testicles. COMPARISON:  None. FINDINGS: Right testicle Measurements: 4.9 x 2.2 x 2.8 cm. No mass or microlithiasis visualized. Left testicle Measurements: 4.5 x  2.1 x 3.0 cm. No mass or microlithiasis visualized. Right epididymis:  10 x 7 x 8 mm cyst at the epididymal head. Left epididymis:  Normal in size and appearance. Hydrocele:  No significant fluid. Varicocele:  None visualized. Pulsed Doppler interrogation of both testes demonstrates normal low resistance arterial and venous waveforms bilaterally. IMPRESSION: 1. Unremarkable appearance of the testicles. No acute abnormality identified. 2. 10 mm right epididymal cyst or spermatocele. Electronically Signed   By: Sebastian Ache M.D.   On: 12/04/2018 13:30   US Abdomen Limited Ruq  Result Date: 12/04/2018 CLINICAL DATA:  Right upper quadrant pain EXAM: ULTRASOUND ABDOMEN LIMITED RIGHT UPPER QUADRANT COMPARISON:  None. FINDINGS: Gallbladder: No gallstones or wall thickening visualized. There is no pericholecystic fluid. No sonographic Murphy sign noted by sonographer. Common bile duct: Diameter: 3 mm. No intrahepatic or extrahepatic biliary duct dilatation. Liver: No focal lesion identified. Within normal limits in parenchymal echogenicity. Portal vein is patent on color Doppler imaging with normal direction of blood flow towards the liver. IMPRESSION: Study within normal limits. Electronically Signed   By: Bretta Bang III M.D.   On: 12/04/2018 13:26    Assessment and Plan:   Lance Garcia is a 33 y.o. y/o male who has a history of long-standing abdominal pain.  The patient states he was seen years ago by a another Solicitor in town and reports that he was told that a workup could not be done because he had no insurance at the time. The patient now comes in with chronic abdominal pain that has been worse recently with a 21 pound weight loss over the last 2 months.  The patient's worrisome symptoms include rectal bleeding a mother with colon polyps and the aforementioned weight loss.  The patient has been explained that some of his abdominal pain is clearly musculoskeletal in nature but his worry  symptoms will be evaluated with an upper endoscopy and colonoscopy. I have discussed risks & benefits which include, but are not limited to, bleeding, infection, perforation & drug reaction.  The patient agrees with this plan & written consent will be obtained.     Midge Minium, MD. Clementeen Graham    Note: This dictation was prepared with Dragon dictation along with smaller phrase technology. Any transcriptional errors that result from this process are unintentional.

## 2018-12-13 NOTE — H&P (View-Only) (Signed)
Gastroenterology Consultation  Referring Provider:     Loleta Rose, MD Primary Care Physician:  Patient, No Pcp Per Primary Gastroenterologist:  Dr. Servando Snare     Reason for Consultation:     Abdominal pain        HPI:   Lance Garcia is a 33 y.o. y/o male referred for consultation & management of Abdominal pain by Dr. Patient, No Pcp Per.  This patient comes in today after being seen in emergency room for abdominal pain. The patient was seen on May 24 and then again yesterday in the ER for the abdominal pain.  The patient comes with his fiance who reports that the abdominal pain has been present for many years.  The patient states the abdominal pain is so severe that he hasn't been able to eat and has lost 21 pounds in the last 2 months.  He also reports that his symptoms are worse when he lifts things or moves around.  He has not been able to lift things at work because the abdominal pain.  There is no report of any black stools or bloody stools but the patient's fiance states that he has had some bright red blood on the toilet paper the past.  The patient also reports that his mother had a history of polyps with colonoscopies every 3-5 years starting in her 30s because the polyps.  The patient has never had a colonoscopy.  He denies any family history of colon cancer or inflammatory bowel disease.  There are no reports of any joint pain associated with his abdominal pain.  His abdominal pain is throughout his whole abdomen but mostly centered under the ribs bilaterally and on the right flank.  He also reports that the abdominal pain goes into his back.  The patient states that when he goes to move his bowels if he does any straining he has pain in his abdomen back and radiates down to his testicles.  He is reporting that his testicular pain is being evaluated by urology.  He also reports that his testicles are swollen. The patient's fiance also reports that the patient suffers from anxiety.   Past Medical History:  Diagnosis Date  . Anxiety   . Family history of adverse reaction to anesthesia    PTS MOM-N/V  . Headache    h/o migraines  . History of kidney stones     Past Surgical History:  Procedure Laterality Date  . CYSTOSCOPY WITH STENT PLACEMENT Left 08/03/2016   Procedure: CYSTOSCOPY WITH STENT PLACEMENT;  Surgeon: Vanna Scotland, MD;  Location: [facility];  Service: Urology;  Laterality: Left;  . KIDNEY STONE SURGERY    . URETEROSCOPY WITH HOLMIUM LASER LITHOTRIPSY Left 08/03/2016   Procedure: URETEROSCOPY WITH HOLMIUM LASER LITHOTRIPSY;  Surgeon: Vanna Scotland, MD;  Location: [facility];  Service: Urology;  Laterality: Left;    Prior to Admission medications   Medication Sig Start Date End Date Taking? Authorizing Provider  acetaminophen (TYLENOL) 500 MG tablet Take 500 mg by mouth every 6 (six) hours as needed.   Yes [provider]  dicyclomine (BENTYL) 10 MG capsule Take 1 capsule (10 mg total) by mouth 3 (three) times daily as needed for up to 14 days for spasms. or abdominal pain 12/12/18 12/26/18 Yes Loleta Rose, MD  pantoprazole (PROTONIX) 20 MG tablet Take 1 tablet (20 mg total) by mouth daily. 12/04/18  Yes Faythe Ghee, PA-C    Family History  Problem Relation Age of Onset  .  Neuropathy Mother   . Colon polyps Mother   . Diabetes Mother   . Throat cancer Father      Social History   Tobacco Use  . Smoking status: Current Every Day Smoker    Packs/day: 1.00    Years: 15.00    Pack years: 15.00    Types: Cigarettes  . Smokeless tobacco: Never Used  Substance Use Topics  . Alcohol use: No  . Drug use: Yes    Types: Marijuana    Comment: used in the last week    Allergies as of 12/13/2018  . (No Known Allergies)    Review of Systems:    All systems reviewed and negative except where noted in HPI.   Physical Exam:  BP 128/71   Pulse 87   Temp 98.4 F (36.9 C) (Oral)   Ht 5\' 8"  (1.727 m)   Wt 128 lb 3.2 oz (58.2 kg)   BMI  19.49 kg/m  No LMP for male patient. General:   Alert,  Well-developed, well-nourished, pleasant and cooperative in NAD Head:  Normocephalic and atraumatic. Eyes:  Sclera clear, no icterus.   Conjunctiva pink. Ears:  Normal auditory acuity. Neck:  Supple; no masses or thyromegaly. Lungs:  Respirations even and unlabored.  Clear throughout to auscultation.   No wheezes, crackles, or rhonchi. No acute distress. Heart:  Regular rate and rhythm; no murmurs, clicks, rubs, or gallops. Abdomen:  Normal bowel sounds.  No bruits.  Soft, Abdominal wall tenderness with reproducing of the abdominal tenderness with 1 finger palpation while flexing the abdominal wall muscles.  Positive Carnett sign.  There is no rebound or guarding. Rectal:  Deferred.  Msk:  Symmetrical without gross deformities.  Good, equal movement & strength bilaterally. Pulses:  Normal pulses noted. Extremities:  No clubbing or edema.  No cyanosis. Neurologic:  Alert and oriented x3;  grossly normal neurologically. Skin:  Intact without significant lesions or rashes.  No jaundice. Lymph Nodes:  No significant cervical adenopathy. Psych:  Alert and cooperative. Normal mood and affect.  Imaging Studies: Ct Abdomen Pelvis W Contrast  Result Date: 12/04/2018 CLINICAL DATA:  Epigastric region pain radiating toward left side EXAM: CT ABDOMEN AND PELVIS WITH CONTRAST TECHNIQUE: Multidetector CT imaging of the abdomen and pelvis was performed using the standard protocol following bolus administration of intravenous contrast. CONTRAST:  75mL OMNIPAQUE IOHEXOL 300 MG/ML  SOLN COMPARISON:  June 22, 2016 FINDINGS: Lower chest: Lung bases are clear. Hepatobiliary: There are scattered subcentimeter apparent cysts throughout the liver. No noncystic liver lesions are evident. Gallbladder wall is not appreciably thickened. There is no biliary duct dilatation. Pancreas: There is no pancreatic mass or inflammatory focus. Spleen: No splenic lesions  are evident. Adrenals/Urinary Tract: Adrenals bilaterally appear unremarkable. Kidneys bilaterally show no evident mass or hydronephrosis on either side. There is an extrarenal pelvis on the left, an anatomic variant. There is no evident renal or ureteral calculus on either side. Urinary bladder is midline with wall thickness within normal limits. Stomach/Bowel: There is no appreciable bowel wall or mesenteric thickening. There is no evident bowel obstruction. Terminal ileum appears unremarkable. There is no evident free air or portal venous air. Vascular/Lymphatic: There is no abdominal aortic aneurysm. No appreciable vascular lesions are evident. No adenopathy is evident in the abdomen or pelvis. Reproductive: Prostate and seminal vesicles are normal in size and contour. No evident pelvic mass. Other: Appendix appears normal. There is no evident abscess or ascites in the abdomen or pelvis. Musculoskeletal: There  are no blastic or lytic bone lesions. There is no intramuscular or abdominal wall lesion. IMPRESSION: 1. A cause for patient's symptoms has not been established with this study. 2. No appreciable renal or ureteral calculus. No hydronephrosis. Urinary bladder wall thickness is within normal limits. 3. No bowel obstruction or bowel wall thickening. No abscess in the abdomen or pelvis. Appendix appears normal. Electronically Signed   By: Bretta Bang III M.D.   On: 12/04/2018 14:13   US Scrotum W/doppler  Result Date: 12/04/2018 CLINICAL DATA:  Left testicular pain for 2-3 weeks. EXAM: SCROTAL ULTRASOUND DOPPLER ULTRASOUND OF THE TESTICLES TECHNIQUE: Complete ultrasound examination of the testicles, epididymis, and other scrotal structures was performed. Color and spectral Doppler ultrasound were also utilized to evaluate blood flow to the testicles. COMPARISON:  None. FINDINGS: Right testicle Measurements: 4.9 x 2.2 x 2.8 cm. No mass or microlithiasis visualized. Left testicle Measurements: 4.5 x  2.1 x 3.0 cm. No mass or microlithiasis visualized. Right epididymis:  10 x 7 x 8 mm cyst at the epididymal head. Left epididymis:  Normal in size and appearance. Hydrocele:  No significant fluid. Varicocele:  None visualized. Pulsed Doppler interrogation of both testes demonstrates normal low resistance arterial and venous waveforms bilaterally. IMPRESSION: 1. Unremarkable appearance of the testicles. No acute abnormality identified. 2. 10 mm right epididymal cyst or spermatocele. Electronically Signed   By: Sebastian Ache M.D.   On: 12/04/2018 13:30   US Abdomen Limited Ruq  Result Date: 12/04/2018 CLINICAL DATA:  Right upper quadrant pain EXAM: ULTRASOUND ABDOMEN LIMITED RIGHT UPPER QUADRANT COMPARISON:  None. FINDINGS: Gallbladder: No gallstones or wall thickening visualized. There is no pericholecystic fluid. No sonographic Murphy sign noted by sonographer. Common bile duct: Diameter: 3 mm. No intrahepatic or extrahepatic biliary duct dilatation. Liver: No focal lesion identified. Within normal limits in parenchymal echogenicity. Portal vein is patent on color Doppler imaging with normal direction of blood flow towards the liver. IMPRESSION: Study within normal limits. Electronically Signed   By: Bretta Bang III M.D.   On: 12/04/2018 13:26    Assessment and Plan:   AHADU SKAHAN is a 33 y.o. y/o male who has a history of long-standing abdominal pain.  The patient states he was seen years ago by a another Solicitor in town and reports that he was told that a workup could not be done because he had no insurance at the time. The patient now comes in with chronic abdominal pain that has been worse recently with a 21 pound weight loss over the last 2 months.  The patient's worrisome symptoms include rectal bleeding a mother with colon polyps and the aforementioned weight loss.  The patient has been explained that some of his abdominal pain is clearly musculoskeletal in nature but his worry  symptoms will be evaluated with an upper endoscopy and colonoscopy. I have discussed risks & benefits which include, but are not limited to, bleeding, infection, perforation & drug reaction.  The patient agrees with this plan & written consent will be obtained.     Midge Minium, MD. Clementeen Graham    Note: This dictation was prepared with Dragon dictation along with smaller phrase technology. Any transcriptional errors that result from this process are unintentional.

## 2018-12-14 ENCOUNTER — Encounter
Admission: RE | Admit: 2018-12-14 | Discharge: 2018-12-14 | Disposition: A | Discharge status: Home / Self Care | Payer: HRSA Program | Source: Ambulatory Visit | Attending: Gastroenterology | Admitting: Gastroenterology

## 2018-12-14 ENCOUNTER — Encounter: Payer: Self-pay | Admitting: *Deleted

## 2018-12-14 ENCOUNTER — Other Ambulatory Visit: Payer: Self-pay

## 2018-12-14 DIAGNOSIS — R1084 Generalized abdominal pain: Secondary | ICD-10-CM

## 2018-12-14 DIAGNOSIS — Z1159 Encounter for screening for other viral diseases: Secondary | ICD-10-CM | POA: Diagnosis not present

## 2018-12-14 DIAGNOSIS — Z01812 Encounter for preprocedural laboratory examination: Secondary | ICD-10-CM | POA: Diagnosis not present

## 2018-12-14 DIAGNOSIS — R634 Abnormal weight loss: Secondary | ICD-10-CM

## 2018-12-14 LAB — SARS CORONAVIRUS 2 BY RT PCR (HOSPITAL ORDER, PERFORMED IN ~~LOC~~ HOSPITAL LAB): SARS Coronavirus 2: NEGATIVE

## 2018-12-15 NOTE — Discharge Instructions (Signed)
General Anesthesia, Adult, Care After  This sheet gives you information about how to care for yourself after your procedure. Your health care provider may also give you more specific instructions. If you have problems or questions, contact your health care provider.  What can I expect after the procedure?  After the procedure, the following side effects are common:  Pain or discomfort at the IV site.  Nausea.  Vomiting.  Sore throat.  Trouble concentrating.  Feeling cold or chills.  Weak or tired.  Sleepiness and fatigue.  Soreness and body aches. These side effects can affect parts of the body that were not involved in surgery.  Follow these instructions at home:    For at least 24 hours after the procedure:  Have a responsible adult stay with you. It is important to have someone help care for you until you are awake and alert.  Rest as needed.  Do not:  Participate in activities in which you could fall or become injured.  Drive.  Use heavy machinery.  Drink alcohol.  Take sleeping pills or medicines that cause drowsiness.  Make important decisions or sign legal documents.  Take care of children on your own.  Eating and drinking  Follow any instructions from your health care provider about eating or drinking restrictions.  When you feel hungry, start by eating small amounts of foods that are soft and easy to digest (bland), such as toast. Gradually return to your regular diet.  Drink enough fluid to keep your urine pale yellow.  If you vomit, rehydrate by drinking water, juice, or clear broth.  General instructions  If you have sleep apnea, surgery and certain medicines can increase your risk for breathing problems. Follow instructions from your health care provider about wearing your sleep device:  Anytime you are sleeping, including during daytime naps.  While taking prescription pain medicines, sleeping medicines, or medicines that make you drowsy.  Return to your normal activities as told by your health care  provider. Ask your health care provider what activities are safe for you.  Take over-the-counter and prescription medicines only as told by your health care provider.  If you smoke, do not smoke without supervision.  Keep all follow-up visits as told by your health care provider. This is important.  Contact a health care provider if:  You have nausea or vomiting that does not get better with medicine.  You cannot eat or drink without vomiting.  You have pain that does not get better with medicine.  You are unable to pass urine.  You develop a skin rash.  You have a fever.  You have redness around your IV site that gets worse.  Get help right away if:  You have difficulty breathing.  You have chest pain.  You have blood in your urine or stool, or you vomit blood.  Summary  After the procedure, it is common to have a sore throat or nausea. It is also common to feel tired.  Have a responsible adult stay with you for the first 24 hours after general anesthesia. It is important to have someone help care for you until you are awake and alert.  When you feel hungry, start by eating small amounts of foods that are soft and easy to digest (bland), such as toast. Gradually return to your regular diet.  Drink enough fluid to keep your urine pale yellow.  Return to your normal activities as told by your health care provider. Ask your health care   provider what activities are safe for you.  This information is not intended to replace advice given to you by your health care provider. Make sure you discuss any questions you have with your health care provider.  Document Released: 10/05/2000 Document Revised: 02/12/2017 Document Reviewed: 02/12/2017  [employer] Interactive Patient Education  2019 [employer]

## 2018-12-16 ENCOUNTER — Other Ambulatory Visit: Payer: Self-pay

## 2018-12-16 ENCOUNTER — Ambulatory Visit
Admission: RE | Admit: 2018-12-16 | Discharge: 2018-12-16 | Disposition: A | Discharge status: Home / Self Care | Payer: Self-pay | Attending: Gastroenterology | Admitting: Gastroenterology

## 2018-12-16 ENCOUNTER — Ambulatory Visit: Payer: Self-pay | Admitting: Anesthesiology

## 2018-12-16 ENCOUNTER — Encounter
Admission: RE | Disposition: A | Discharge status: Home / Self Care | Payer: Self-pay | Source: Home / Self Care | Attending: Gastroenterology

## 2018-12-16 DIAGNOSIS — Z681 Body mass index (BMI) 19 or less, adult: Secondary | ICD-10-CM | POA: Insufficient documentation

## 2018-12-16 DIAGNOSIS — R634 Abnormal weight loss: Secondary | ICD-10-CM

## 2018-12-16 DIAGNOSIS — R1084 Generalized abdominal pain: Secondary | ICD-10-CM

## 2018-12-16 DIAGNOSIS — F17210 Nicotine dependence, cigarettes, uncomplicated: Secondary | ICD-10-CM | POA: Insufficient documentation

## 2018-12-16 DIAGNOSIS — K449 Diaphragmatic hernia without obstruction or gangrene: Secondary | ICD-10-CM | POA: Insufficient documentation

## 2018-12-16 HISTORY — PX: ESOPHAGOGASTRODUODENOSCOPY (EGD) WITH PROPOFOL: SHX5813

## 2018-12-16 HISTORY — PX: COLONOSCOPY WITH PROPOFOL: SHX5780

## 2018-12-16 HISTORY — DX: Calculus of kidney: N20.0

## 2018-12-16 SURGERY — COLONOSCOPY WITH PROPOFOL
Anesthesia: General | Site: Rectum

## 2018-12-16 MED ORDER — STERILE WATER FOR IRRIGATION IR SOLN
Status: DC | PRN
  Administered 2018-12-16: 12:00:00

## 2018-12-16 MED ORDER — GLYCOPYRROLATE 0.2 MG/ML IJ SOLN
INTRAMUSCULAR | Status: DC | PRN
  Administered 2018-12-16: 0.1 mg via INTRAVENOUS

## 2018-12-16 MED ORDER — LACTATED RINGERS IV SOLN
INTRAVENOUS | Status: DC
  Administered 2018-12-16: 10:00:00 via INTRAVENOUS

## 2018-12-16 MED ORDER — PROPOFOL 10 MG/ML IV BOLUS
INTRAVENOUS | Status: DC | PRN
  Administered 2018-12-16: 20 mg via INTRAVENOUS
  Administered 2018-12-16 (×2): 30 mg via INTRAVENOUS
  Administered 2018-12-16: 150 mg via INTRAVENOUS
  Administered 2018-12-16: 50 mg via INTRAVENOUS
  Administered 2018-12-16: 40 mg via INTRAVENOUS
  Administered 2018-12-16: 30 mg via INTRAVENOUS

## 2018-12-16 MED ORDER — SODIUM CHLORIDE 0.9 % IV SOLN: INTRAVENOUS | Status: DC

## 2018-12-16 MED ORDER — LIDOCAINE HCL (CARDIAC) PF 100 MG/5ML IV SOSY
PREFILLED_SYRINGE | INTRAVENOUS | Status: DC | PRN
  Administered 2018-12-16: 30 mg via INTRAVENOUS

## 2018-12-16 SURGICAL SUPPLY — 7 items
BLOCK BITE 60FR ADLT L/F GRN (MISCELLANEOUS) ×4 IMPLANT
CANISTER SUCT 1200ML W/VALVE (MISCELLANEOUS) ×4 IMPLANT
FORCEPS BIOP RAD 4 LRG CAP 4 (CUTTING FORCEPS) ×2 IMPLANT
GOWN CVR UNV OPN BCK APRN NK (MISCELLANEOUS) ×4 IMPLANT
GOWN ISOL THUMB LOOP REG UNIV (MISCELLANEOUS) ×4
KIT ENDO PROCEDURE OLY (KITS) ×4 IMPLANT
WATER STERILE IRR 250ML POUR (IV SOLUTION) ×4 IMPLANT

## 2018-12-16 NOTE — Op Note (Signed)
[facility] Gastroenterology Patient Name: Lance Garcia Procedure Date: 12/16/2018 11:15 AM MRN: [id number] Account #: [id number] Date of Birth: 06-08-1986 Admit Type: Outpatient Age: 33 Room: [facility] OR ROOM 01 Gender: Male Note Status: Finalized Procedure:            Colonoscopy Indications:          Generalized abdominal pain, Weight loss Providers:            Midge Minium MD, MD Medicines:            Propofol per Anesthesia Complications:        No immediate complications. Procedure:            Pre-Anesthesia Assessment:                       - Prior to the procedure, a History and Physical was                        performed, and patient medications and allergies were                        reviewed. The patient's tolerance of previous                        anesthesia was also reviewed. The risks and benefits of                        the procedure and the sedation options and risks were                        discussed with the patient. All questions were                        answered, and informed consent was obtained. Prior                        Anticoagulants: The patient has taken no previous                        anticoagulant or antiplatelet agents. ASA Grade                        Assessment: II - A patient with mild systemic disease.                        After reviewing the risks and benefits, the patient was                        deemed in satisfactory condition to undergo the                        procedure.                       After obtaining informed consent, the colonoscope was                        passed under direct vision. Throughout the procedure,                        the patient's blood pressure,  pulse, and oxygen                        saturations were monitored continuously. The                        Colonoscope was introduced through the anus and                        advanced to the the terminal ileum. The colonoscopy  was                        performed without difficulty. The patient tolerated the                        procedure well. The quality of the bowel preparation                        was poor. Findings:      The perianal and digital rectal examinations were normal.      The terminal ileum appeared normal. Biopsies were taken with a cold       forceps for histology.      The colon (entire examined portion) appeared normal. Impression:           - Preparation of the colon was poor.                       - The examined portion of the ileum was normal.                        Biopsied.                       - The entire examined colon is normal. Recommendation:       - Discharge patient to home.                       - Resume previous diet.                       - Continue present medications.                       - Await pathology results. Procedure Code(s):    --- Professional ---                       [phone], Colonoscopy, flexible; with biopsy, single or                        multiple Diagnosis Code(s):    --- Professional ---                       R10.84, Generalized abdominal pain                       R63.4, Abnormal weight loss CPT copyright 2019 American Medical Association. All rights reserved. The codes documented in this report are preliminary and upon coder review may  be revised to meet current compliance requirements. Midge Minium MD, MD 12/16/2018 11:46:20 AM This report has been signed electronically. Number of Addenda: 0 Note Initiated On: 12/16/2018 11:15 AM Scope Withdrawal Time:  0 hours 8 minutes 12 seconds  Total Procedure Duration: 0 hours 8 minutes 49 seconds       [facility]

## 2018-12-16 NOTE — Anesthesia Preprocedure Evaluation (Signed)
Anesthesia Evaluation  Patient identified by MRN, date of birth, ID band Patient awake    Reviewed: Allergy & Precautions, NPO status , Patient's Chart, lab work & pertinent test results  Airway Mallampati: II  TM Distance: >3 FB     Dental   Pulmonary Current Smoker,    breath sounds clear to auscultation       Cardiovascular negative cardio ROS   Rhythm:Regular Rate:Normal     Neuro/Psych  Headaches, Anxiety    GI/Hepatic negative GI ROS,   Endo/Other    Renal/GU Renal disease (stones)     Musculoskeletal   Abdominal   Peds  Hematology   Anesthesia Other Findings   Reproductive/Obstetrics                             Anesthesia Physical Anesthesia Plan  ASA: II  Anesthesia Plan: General   Post-op Pain Management:    Induction: Intravenous  PONV Risk Score and Plan: TIVA  Airway Management Planned: Natural Airway and Nasal Cannula  Additional Equipment:   Intra-op Plan:   Post-operative Plan:   Informed Consent: I have reviewed the patients History and Physical, chart, labs and discussed the procedure including the risks, benefits and alternatives for the proposed anesthesia with the patient or authorized representative who has indicated his/her understanding and acceptance.       Plan Discussed with: CRNA  Anesthesia Plan Comments:         Anesthesia Quick Evaluation

## 2018-12-16 NOTE — Anesthesia Postprocedure Evaluation (Signed)
Anesthesia Post Note  Patient: [name]  Procedure(s) Performed: COLONOSCOPY WITH PROPOFOL (N/A Rectum) ESOPHAGOGASTRODUODENOSCOPY (EGD) WITH PROPOFOL (N/A Esophagus)  Patient location during evaluation: PACU Anesthesia Type: General Level of consciousness: awake Pain management: pain level controlled Vital Signs Assessment: post-procedure vital signs reviewed and stable Respiratory status: respiratory function stable Cardiovascular status: stable Postop Assessment: no signs of nausea or vomiting Anesthetic complications: no    Jola Babinski

## 2018-12-16 NOTE — Transfer of Care (Signed)
Immediate Anesthesia Transfer of Care Note  Patient: Lance Garcia  Procedure(s) Performed: COLONOSCOPY WITH PROPOFOL (N/A Rectum) ESOPHAGOGASTRODUODENOSCOPY (EGD) WITH PROPOFOL (N/A Esophagus)  Patient Location: PACU  Anesthesia Type: General  Level of Consciousness: awake, alert  and patient cooperative  Airway and Oxygen Therapy: Patient Spontanous Breathing and Patient connected to supplemental oxygen  Post-op Assessment: Post-op Vital signs reviewed, Patient's Cardiovascular Status Stable, Respiratory Function Stable, Patent Airway and No signs of Nausea or vomiting  Post-op Vital Signs: Reviewed and stable  Complications: No apparent anesthesia complications

## 2018-12-16 NOTE — Op Note (Signed)
[facility] Gastroenterology Patient Name: Lance Garcia Procedure Date: 12/16/2018 11:15 AM MRN: [id number] Account #: [id number] Date of Birth: [birth year] Admit Type: Outpatient Age: 33 Room: [facility] OR ROOM 01 Gender: Male Note Status: Finalized Procedure:            Upper GI endoscopy Indications:          Generalized abdominal pain, Weight loss Providers:            Midge Minium MD, MD Medicines:            Propofol per Anesthesia Complications:        No immediate complications. Procedure:            Pre-Anesthesia Assessment:                       - Prior to the procedure, a History and Physical was                        performed, and patient medications and allergies were                        reviewed. The patient's tolerance of previous                        anesthesia was also reviewed. The risks and benefits of                        the procedure and the sedation options and risks were                        discussed with the patient. All questions were                        answered, and informed consent was obtained. Prior                        Anticoagulants: The patient has taken no previous                        anticoagulant or antiplatelet agents. ASA Grade                        Assessment: II - A patient with mild systemic disease.                        After reviewing the risks and benefits, the patient was                        deemed in satisfactory condition to undergo the                        procedure.                       After obtaining informed consent, the endoscope was                        passed under direct vision. Throughout the procedure,                        the patient's  blood pressure, pulse, and oxygen                        saturations were monitored continuously. The                        Endosonoscope was introduced through the mouth, and                        advanced to the second part of duodenum. The  upper GI                        endoscopy was accomplished without difficulty. The                        patient tolerated the procedure well. Findings:      A small hiatal hernia was present.      The entire examined stomach was normal.      The examined duodenum was normal. Biopsies were taken with a cold       forceps for histology. Impression:           - Small hiatal hernia.                       - Normal stomach.                       - Normal examined duodenum. Biopsied. Recommendation:       - Discharge patient to home.                       - Resume previous diet.                       - Continue present medications.                       - Await pathology results. Procedure Code(s):    --- Professional ---                       [phone], Esophagogastroduodenoscopy, flexible, transoral;                        with biopsy, single or multiple Diagnosis Code(s):    --- Professional ---                       R63.4, Abnormal weight loss                       R10.84, Generalized abdominal pain CPT copyright 2019 American Medical Association. All rights reserved. The codes documented in this report are preliminary and upon coder review may  be revised to meet current compliance requirements. Midge Miniumarren Ryon Layton MD, MD 12/16/2018 11:29:53 AM This report has been signed electronically. Number of Addenda: 0 Note Initiated On: 12/16/2018 11:15 AM Total Procedure Duration: 0 hours 2 minutes 34 seconds       [facility]

## 2018-12-16 NOTE — Anesthesia Procedure Notes (Signed)
Date/Time: 12/16/2018 11:19 AM Performed by: Maree Krabbe, CRNA Pre-anesthesia Checklist: Patient identified, Emergency Drugs available, Suction available, Timeout performed and Patient being monitored Patient Re-evaluated:Patient Re-evaluated prior to induction Oxygen Delivery Method: Nasal cannula Placement Confirmation: positive ETCO2

## 2018-12-16 NOTE — Interval H&P Note (Signed)
History and Physical Interval Note:  12/16/2018 10:33 AM  Lance Garcia  has presented today for surgery, with the diagnosis of Loss of weight R63.4 Generalized abdominal pain R10.84.  The various methods of treatment have been discussed with the patient and family. After consideration of risks, benefits and other options for treatment, the patient has consented to  Procedure(s): COLONOSCOPY WITH PROPOFOL (N/A) ESOPHAGOGASTRODUODENOSCOPY (EGD) WITH PROPOFOL (N/A) as a surgical intervention.  The patient's history has been reviewed, patient examined, no change in status, stable for surgery.  I have reviewed the patient's chart and labs.  Questions were answered to the patient's satisfaction.     Joany Khatib [employer]

## 2018-12-19 ENCOUNTER — Encounter: Payer: Self-pay | Admitting: Gastroenterology

## 2018-12-19 ENCOUNTER — Ambulatory Visit: Payer: Self-pay | Admitting: Gastroenterology

## 2018-12-19 ENCOUNTER — Other Ambulatory Visit: Payer: Self-pay

## 2018-12-19 MED ORDER — DICYCLOMINE HCL 10 MG PO CAPS: 10.0000 mg | ORAL_CAPSULE | Freq: Three times a day (TID) | ORAL | 3 refills | Status: DC | PRN

## 2018-12-20 ENCOUNTER — Other Ambulatory Visit: Payer: Self-pay

## 2018-12-20 ENCOUNTER — Encounter: Payer: Self-pay | Admitting: Urology

## 2018-12-20 ENCOUNTER — Ambulatory Visit (INDEPENDENT_AMBULATORY_CARE_PROVIDER_SITE_OTHER): Payer: Self-pay | Admitting: Urology

## 2018-12-20 VITALS — BP 134/84 | HR 101 | Ht 68.0 in | Wt 127.0 lb

## 2018-12-20 DIAGNOSIS — N50819 Testicular pain, unspecified: Secondary | ICD-10-CM

## 2018-12-20 DIAGNOSIS — G8929 Other chronic pain: Secondary | ICD-10-CM

## 2018-12-20 DIAGNOSIS — N4341 Spermatocele of epididymis, single: Secondary | ICD-10-CM

## 2018-12-20 DIAGNOSIS — F419 Anxiety disorder, unspecified: Secondary | ICD-10-CM
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
## 2019-03-08 ENCOUNTER — Other Ambulatory Visit: Payer: Self-pay

## 2019-03-08 ENCOUNTER — Emergency Department (HOSPITAL_COMMUNITY)
Admission: EM | Admit: 2019-03-08 | Discharge: 2019-03-08 | Disposition: A | Discharge status: Home / Self Care | Payer: Self-pay | Attending: Emergency Medicine | Admitting: Emergency Medicine

## 2019-03-08 ENCOUNTER — Encounter (HOSPITAL_COMMUNITY): Payer: Self-pay

## 2019-03-08 ENCOUNTER — Emergency Department (HOSPITAL_COMMUNITY): Payer: Self-pay

## 2019-03-08 DIAGNOSIS — N50811 Right testicular pain: Secondary | ICD-10-CM | POA: Insufficient documentation

## 2019-03-08 DIAGNOSIS — Z79899 Other long term (current) drug therapy: Secondary | ICD-10-CM | POA: Insufficient documentation

## 2019-03-08 DIAGNOSIS — F17210 Nicotine dependence, cigarettes, uncomplicated: Secondary | ICD-10-CM | POA: Insufficient documentation

## 2019-03-08 DIAGNOSIS — N50819 Testicular pain, unspecified: Secondary | ICD-10-CM

## 2019-03-08 LAB — URINALYSIS, ROUTINE W REFLEX MICROSCOPIC
Bilirubin Urine: NEGATIVE
Glucose, UA: NEGATIVE mg/dL
Hgb urine dipstick: NEGATIVE
Ketones, ur: NEGATIVE mg/dL
Leukocytes,Ua: NEGATIVE
Nitrite: NEGATIVE
Protein, ur: NEGATIVE mg/dL
Specific Gravity, Urine: 1.011 (ref 1.005–1.030)
pH: 6 (ref 5.0–8.0)

## 2019-03-08 NOTE — ED Provider Notes (Signed)
[facility]-EMERGENCY DEPT Provider Note   CSN: [id number] Arrival date & time: 03/08/19  0234     History   Chief Complaint Chief Complaint  Patient presents with  . Testicle Pain    HPI Lance Garcia is a 33 y.o. male.     The history is provided by the patient and medical records.  Testicle Pain     33 year old male with history of anxiety, headaches, kidney stones, presenting to the ED with testicle pain.  Patient reports he woke up at 1 AM today from sleep and had severe suprapubic abdominal pain extending into the right testicle.  He did not notice any swelling at that time.  States pain was so intense he felt like he was going to vomit so went to the bathroom but never threw up.  States he came back and wife said he looked "pale" so called EMS.  He did not lose consciousness.  States pain has started to dissipate on its own, now 3/10.  He reports he was diagnosed with an epididymal cyst a few months ago and has followed with urology for this.  States lately things seem to be getting worse.  He denies any dysuria, hematuria, difficulty urinating.  No penile discharge, new sexual partner, or concern for STD.  He is followed by urology in [place].  Past Medical History:  Diagnosis Date  . Anxiety   . Family history of adverse reaction to anesthesia    PTS MOM-N/V  . Headache    h/o migraines, approx 2x/yr  . Heat stroke [address] [place]  . History of kidney stones   . Kidney stone     Patient Active Problem List   Diagnosis Date Noted  . Generalized abdominal pain   . Loss of weight     Past Surgical History:  Procedure Laterality Date  . COLONOSCOPY WITH PROPOFOL N/A 12/16/2018   Procedure: COLONOSCOPY WITH PROPOFOL;  Surgeon: Midge Minium, MD;  Location: [facility];  Service: Endoscopy;  Laterality: N/A;  . CYSTOSCOPY WITH STENT PLACEMENT Left 08/03/2016   Procedure: CYSTOSCOPY WITH STENT PLACEMENT;  Surgeon: Vanna Scotland, MD;  Location: [facility];  Service: Urology;  Laterality: Left;  . ESOPHAGOGASTRODUODENOSCOPY (EGD) WITH PROPOFOL N/A 12/16/2018   Procedure: ESOPHAGOGASTRODUODENOSCOPY (EGD) WITH PROPOFOL;  Surgeon: Midge Minium, MD;  Location: [facility];  Service: Endoscopy;  Laterality: N/A;  . KIDNEY STONE SURGERY    . URETEROSCOPY WITH HOLMIUM LASER LITHOTRIPSY Left 08/03/2016   Procedure: URETEROSCOPY WITH HOLMIUM LASER LITHOTRIPSY;  Surgeon: Vanna Scotland, MD;  Location: [facility];  Service: Urology;  Laterality: Left;        Home Medications    Prior to Admission medications   Medication Sig Start Date End Date Taking? Authorizing Provider  acetaminophen (TYLENOL) 500 MG tablet Take 500 mg by mouth every 6 (six) hours as needed.    [provider]  dicyclomine (BENTYL) 10 MG capsule Take 1 capsule (10 mg total) by mouth 3 (three) times daily as needed for spasms. 12/19/18   Midge Minium, MD  Multiple Vitamin (MULTIVITAMIN) tablet Take 1 tablet by mouth daily.    [provider]  pantoprazole (PROTONIX) 20 MG tablet Take 1 tablet (20 mg total) by mouth daily. 12/04/18   Sherrie Mustache Roselyn Bering, PA-C    Family History Family History  Problem Relation Age of Onset  . Neuropathy Mother   . Colon polyps Mother   . Diabetes Mother   .  Throat cancer Father     Social History Social History   Tobacco Use  . Smoking status: Current Every Day Smoker    Packs/day: 1.00    Years: 15.00    Pack years: 15.00    Types: Cigarettes  . Smokeless tobacco: Never Used  Substance Use Topics  . Alcohol use: No  . Drug use: Yes    Types: Marijuana    Comment: used in the last week     Allergies   Patient has no known allergies.   Review of Systems Review of Systems  Genitourinary: Positive for testicular pain.  All other systems reviewed and are negative.    Physical Exam Updated Vital Signs BP 115/77 (BP Location: Right Arm)   Pulse 77   Temp 97.8 F (36.6 C)  (Oral)   Resp (!) 24   Ht 5\' 8"  (1.727 m)   Wt 58.1 kg   SpO2 99%   BMI 19.46 kg/m   Physical Exam Vitals signs and nursing note reviewed. Exam conducted with a chaperone present.  Constitutional:      Appearance: He is well-developed.  HENT:     Head: Normocephalic and atraumatic.  Eyes:     Conjunctiva/sclera: Conjunctivae normal.     Pupils: Pupils are equal, round, and reactive to light.  Neck:     Musculoskeletal: Normal range of motion.  Cardiovascular:     Rate and Rhythm: Normal rate and regular rhythm.     Heart sounds: Normal heart sounds.  Pulmonary:     Effort: Pulmonary effort is normal.     Breath sounds: Normal breath sounds.  Abdominal:     General: Bowel sounds are normal.     Palpations: Abdomen is soft.  Genitourinary:    Penis: Normal and circumcised.      Comments: Exam chaperoned by RN Testicles overall normal in appearance without visible swelling or palpable masses, normal lie, no penile discharge Musculoskeletal: Normal range of motion.  Skin:    General: Skin is warm and dry.  Neurological:     Mental Status: He is alert and oriented to person, place, and time.      ED Treatments / Results  Labs (all labs ordered are listed, but only abnormal results are displayed) Labs Reviewed  URINALYSIS, ROUTINE W REFLEX MICROSCOPIC  GC/CHLAMYDIA PROBE AMP ([facility]) NOT AT [facility]    EKG None  Radiology US Scrotum W/doppler  Result Date: 03/08/2019 CLINICAL DATA:  33 year old male with right testicular pain x3 months. EXAM: SCROTAL ULTRASOUND DOPPLER ULTRASOUND OF THE TESTICLES TECHNIQUE: Complete ultrasound examination of the testicles, epididymis, and other scrotal structures was performed. Color and spectral Doppler ultrasound were also utilized to evaluate blood flow to the testicles. COMPARISON:  None. FINDINGS: Right testicle Measurements: 4.3 x 2.7 x 2.7 cm. No mass or microlithiasis visualized. Left testicle Measurements: 3.9 x 2.1 x 2.7  cm. No mass or microlithiasis visualized. Right epididymis: Normal in size and appearance. Subcentimeter right femoral head cyst. Left epididymis: Normal in size and appearance. Subcentimeter left epididymal head cyst. Hydrocele:  Small right hydrocele. Varicocele:  None visualized. Pulsed Doppler interrogation of both testes demonstrates normal low resistance arterial and venous waveforms bilaterally. IMPRESSION: 1. Unremarkable testicles with bilateral Doppler detected flow. 2. Small right hydrocele. 3. Small bilateral epididymal head cysts. Electronically Signed   By: Anner Crete M.D.   On: 03/08/2019 03:37    Procedures Procedures (including critical care time)  Medications Ordered in ED Medications - No data to  display   Initial Impression / Assessment and Plan / ED Course  I have reviewed the triage vital signs and the nursing notes.  Pertinent labs & imaging results that were available during my care of the patient were reviewed by me and considered in my medical decision making (see chart for details).  33 year old male presenting to the ED with lower abdominal right testicle pain of sudden onset around 1 AM, waking him from sleep.  Pain has dissipated prior to arrival, now very mild.  He does report history of epididymal cyst in the past, is followed by urology in [place].  He is afebrile and nontoxic.  His abdomen is soft and benign.  Testicles are overall normal in appearance without visible swelling, erythema, or palpable masses.  He has a normal lie.  No discharge and no expressed concern for STD.  Ultrasound here with epididymal cyst and small right hydrocele but no evidence of torsion.  Urine without any signs of infection or hematuria.  Patient has been resting comfortably, no recurrence of severe pain.  Feel he is stable for discharge home to follow-up closely with his urologist as an outpatient.  He will return here for any new or acute changes.  Final Clinical  Impressions(s) / ED Diagnoses   Final diagnoses:  Testicle pain    ED Discharge Orders    None       Garlon HatchetSanders,  M, PA-C 03/08/19 [id number]    Shon BatonHorton, Courtney F, MD 03/11/19 [phone]

## 2019-03-08 NOTE — ED Triage Notes (Signed)
Pt arrived via [facility] stating he has testicular and lower abdominal pain that started at 1 am today. Pt states he was diagnosed with a testicular cyst three months ago but the pain was worse today and felt as though he would pass out. Denies any NVD or urinary symptoms.

## 2019-03-08 NOTE — Discharge Instructions (Signed)
Ultrasound and labs today looked good.  You still have the cysts and a small hydrocele on right side. Follow-up with your urologist. Return here for any new/acute changes.

## 2019-03-09 LAB — GC/CHLAMYDIA PROBE AMP (~~LOC~~) NOT AT ARMC
Chlamydia: NEGATIVE
Neisseria Gonorrhea: NEGATIVE

## 2019-03-21 MED ORDER — PANTOPRAZOLE SODIUM 20 MG PO TBEC: 20.0000 mg | DELAYED_RELEASE_TABLET | Freq: Every day | ORAL | 11 refills | Status: DC

## 2019-03-21 MED ORDER — DICYCLOMINE HCL 10 MG PO CAPS: 10.0000 mg | ORAL_CAPSULE | Freq: Three times a day (TID) | ORAL | 11 refills | Status: DC | PRN

## 2019-03-21 NOTE — Telephone Encounter (Signed)
Per provider I refilled medications 30 days 11 refills.

## 2019-03-24 ENCOUNTER — Encounter: Payer: Self-pay | Admitting: Urology

## 2019-03-24 ENCOUNTER — Other Ambulatory Visit: Payer: Self-pay

## 2019-03-24 ENCOUNTER — Ambulatory Visit (INDEPENDENT_AMBULATORY_CARE_PROVIDER_SITE_OTHER): Payer: Self-pay | Admitting: Urology

## 2019-03-24 VITALS — BP 136/77 | HR 80 | Ht 68.0 in | Wt 128.0 lb

## 2019-03-24 DIAGNOSIS — G8929 Other chronic pain: Secondary | ICD-10-CM

## 2019-03-24 DIAGNOSIS — N50819 Testicular pain, unspecified: Secondary | ICD-10-CM

## 2019-03-24 NOTE — Progress Notes (Signed)
03/24/2019 3:34 PM   Lance BattiestJoshua W Garcia 1985/12/21 [id number]  Referring provider: No referring provider defined for this encounter.  Chief Complaint  Patient presents with  . Testicle Pain    HPI: 33 year old male with chronic testicular pain he returns today after another emergency room visit for testicular discomfort.  He reports that he had a episode of severe right testicular pain exacerbated by position change which started at 1 AM.  The pain was severe and radiated up his right cord to his lower abdomen.  He did have associated nausea without vomiting.  He ultimately called EMS and was brought to the hospital.  Scrotal ultrasound was reassuring with no new findings, stable hydrocele and epididymal cyst.  No evidence of testicular torsion.  Urinalysis and STI testing was negative.  His pain ultimately improved significantly was able to be discharged home.  As per his previous history, he continues to have intermittent scrotal pain, right greater than left.  This is essentially unchanged.  This been going on now for quite some time.  He reports it is worse when he is at rest or thinking about it.  It is better when he is physically active and forgets about the discomfort.   He measures, deep breathing exercises that he is learned in physical therapy.  No urinary symptoms.  He does have a history of bilateral inguinal hernia repair as a baby.   PMH: Past Medical History:  Diagnosis Date  . Anxiety   . Family history of adverse reaction to anesthesia    PTS MOM-N/V  . Headache    h/o migraines, approx 2x/yr  . Heat stroke [address]   [facility], [place]  . History of kidney stones   . Kidney stone     Surgical History: Past Surgical History:  Procedure Laterality Date  . COLONOSCOPY WITH PROPOFOL N/A 12/16/2018   Procedure: COLONOSCOPY WITH PROPOFOL;  Surgeon: Midge MiniumWohl, Darren, MD;  Location[facility];  Service: Endoscopy;  Laterality: N/A;  . CYSTOSCOPY WITH  STENT PLACEMENT Left 08/03/2016   Procedure: CYSTOSCOPY WITH STENT PLACEMENT;  Surgeon: Vanna ScotlandAshley Glema Takaki, MD;  Location: [facility];  Service: Urology;  Laterality: Left;  . ESOPHAGOGASTRODUODENOSCOPY (EGD) WITH PROPOFOL N/A 12/16/2018   Procedure: ESOPHAGOGASTRODUODENOSCOPY (EGD) WITH PROPOFOL;  Surgeon: Midge MiniumWohl, Darren, MD;  Location[facility];  Service: Endoscopy;  Laterality: N/A;  . KIDNEY STONE SURGERY    . URETEROSCOPY WITH HOLMIUM LASER LITHOTRIPSY Left 08/03/2016   Procedure: URETEROSCOPY WITH HOLMIUM LASER LITHOTRIPSY;  Surgeon: Vanna ScotlandAshley Ladislaus Repsher, MD;  Location: [facility];  Service: Urology;  Laterality: Left;    Home Medications:  Allergies as of 03/24/2019   No Known Allergies     Medication List       Accurate as of March 24, 2019  3:34 PM. If you have any questions, ask your nurse or doctor.        acetaminophen 500 MG tablet Commonly known as: TYLENOL Take 500 mg by mouth every 6 (six) hours as needed for mild pain.   dicyclomine 10 MG capsule Commonly known as: Bentyl Take 1 capsule (10 mg total) by mouth 3 (three) times daily as needed for spasms.   multivitamin tablet Take 1 tablet by mouth daily.   pantoprazole 20 MG tablet Commonly known as: PROTONIX Take 1 tablet (20 mg total) by mouth daily.       Allergies: No Known Allergies  Family History: Family History  Problem Relation Age of Onset  . Neuropathy Mother   .  Colon polyps Mother   . Diabetes Mother   . Throat cancer Father     Social History:  reports that he has been smoking cigarettes. He has a 15.00 pack-year smoking history. He has never used smokeless tobacco. He reports current drug use. Drug: Marijuana. He reports that he does not drink alcohol.  ROS: UROLOGY Frequent Urination?: No Hard to postpone urination?: Yes Burning/pain with urination?: No Get up at night to urinate?: Yes Leakage of urine?: No Urine stream starts and stops?: No Trouble starting stream?: No Do you  have to strain to urinate?: No Blood in urine?: No Urinary tract infection?: No Sexually transmitted disease?: No Injury to kidneys or bladder?: No Painful intercourse?: No Weak stream?: No Erection problems?: No Penile pain?: No  Gastrointestinal Nausea?: No Vomiting?: No Indigestion/heartburn?: Yes Diarrhea?: No Constipation?: Yes  Constitutional Fever: No Night sweats?: Yes Weight loss?: Yes Fatigue?: Yes  Skin Skin rash/lesions?: No Itching?: No  Eyes Blurred vision?: No Double vision?: No  Ears/Nose/Throat Sore throat?: No Sinus problems?: No  Hematologic/Lymphatic Swollen glands?: No Easy bruising?: No  Cardiovascular Leg swelling?: No Chest pain?: No  Respiratory Cough?: No Shortness of breath?: No  Endocrine Excessive thirst?: No  Musculoskeletal Back pain?: Yes Joint pain?: No  Neurological Headaches?: No Dizziness?: No  Psychologic Depression?: No Anxiety?: Yes  Physical Exam: BP 136/77   Pulse 80   Ht 5\' 8"  (1.727 m)   Wt 128 lb (58.1 kg)   BMI 19.46 kg/m   Constitutional:  Alert and oriented, No acute distress.  Accompanied by wife today. HEENT: [place] AT, moist mucus membranes.  Trachea midline, no masses. Cardiovascular: No clubbing, cyanosis, or edema. Respiratory: Normal respiratory effort, no increased work of breathing. GI: Abdomen is soft, nontender, nondistended, no abdominal masses GU: Normal circumcised phallus with orthotopic meatus.  Normal bilateral descended testicles, no appreciable hydroceles.  Testicles nontender, normal lie, no masses. Skin: No rashes, bruises or suspicious lesions. Neurologic: Grossly intact, no focal deficits, moving all 4 extremities. Psychiatric: Mildly anxious  Laboratory Data: Lab Results  Component Value Date   WBC 12.3 (H) 12/12/2018   HGB 14.2 12/12/2018   HCT 42.0 12/12/2018   MCV 87.9 12/12/2018   PLT 281 12/12/2018    Lab Results  Component Value Date   CREATININE 0.76  12/12/2018    Pertinent Imaging: Results for orders placed during the hospital encounter of 10/31/18  Abdomen 1 view (KUB)   Narrative CLINICAL DATA:  Flank pain  EXAM: ABDOMEN - 1 VIEW  COMPARISON:  07/23/2016  FINDINGS: Previously seen calcification in the left hemipelvis is no longer identified. No renal calcifications are noted to suggest calculi. The left kidney is somewhat obscured by overlying bowel content. Nonobstructive bowel gas pattern is seen. No bony abnormality is seen.  IMPRESSION: No acute abnormality noted.  No definitive calculi are seen.   Electronically Signed   By: Alcide Clever M.D.   On: 11/01/2018 07:52    CLINICAL DATA:  33 year old male with right testicular pain x3 months.  EXAM: SCROTAL ULTRASOUND  DOPPLER ULTRASOUND OF THE TESTICLES  TECHNIQUE: Complete ultrasound examination of the testicles, epididymis, and other scrotal structures was performed. Color and spectral Doppler ultrasound were also utilized to evaluate blood flow to the testicles.  COMPARISON:  None.  FINDINGS: Right testicle  Measurements: 4.3 x 2.7 x 2.7 cm. No mass or microlithiasis visualized.  Left testicle  Measurements: 3.9 x 2.1 x 2.7 cm. No mass or microlithiasis visualized.  Right epididymis: Normal  in size and appearance. Subcentimeter right femoral head cyst.  Left epididymis: Normal in size and appearance. Subcentimeter left epididymal head cyst.  Hydrocele:  Small right hydrocele.  Varicocele:  None visualized.  Pulsed Doppler interrogation of both testes demonstrates normal low resistance arterial and venous waveforms bilaterally.  IMPRESSION: 1. Unremarkable testicles with bilateral Doppler detected flow. 2. Small right hydrocele. 3. Small bilateral epididymal head cysts.   Electronically Signed   By: Anner Crete M.D.   On: 03/08/2019 03:37  Assessment & Plan:    1. Chronic pain in testicle Chronic  intermittent testicular pain without significant pathology, small epididymal cysts and small hydrocele unrelated and barely appreciable today on exam.  Very reassured by physical exam today which is completely normal.  Pain seems to be exacerbated with inactivity and anxiety.  Some improvement with physical therapy and scrotal support.  We did frank discussion today about the pathophysiology of chronic poorly controlled testicular pain.  This can be very difficult to manage.  We discussed the option of cord block which is temporary relief but also can be combined with a steroid injection to see if that helps.  It also would be diagnostic to prove that his testicular pain is neuropathic in nature.  He is not interested in this.  Alternatively, we discussed the option of referral to pain clinic.  He may benefit from a medication such as Neurontin or gabapentin which I do not routinely manage.  He is interested in this option.  He requests referral.  - Ambulatory referral to Pain Clinic  F/u as needed  Hollice Espy, MD  [facility] [address], [place], [place] [address] [phone]

## 2019-04-06 ENCOUNTER — Other Ambulatory Visit: Payer: Self-pay | Admitting: *Deleted

## 2019-04-06 DIAGNOSIS — N50819 Testicular pain, unspecified: Secondary | ICD-10-CM

## 2019-04-06 DIAGNOSIS — G8929 Other chronic pain: Secondary | ICD-10-CM

## 2019-04-07 ENCOUNTER — Encounter: Payer: Self-pay | Admitting: Physical Medicine & Rehabilitation

## 2019-05-22 ENCOUNTER — Encounter: Payer: Self-pay | Admitting: Physical Medicine & Rehabilitation

## 2019-05-22 ENCOUNTER — Encounter: Payer: Self-pay | Attending: Physical Medicine & Rehabilitation | Admitting: Physical Medicine & Rehabilitation

## 2019-05-22 ENCOUNTER — Other Ambulatory Visit: Payer: Self-pay

## 2019-05-22 VITALS — BP 146/68 | HR 82 | Temp 97.9°F | Ht 68.0 in | Wt 147.0 lb

## 2019-05-22 DIAGNOSIS — N50812 Left testicular pain: Secondary | ICD-10-CM | POA: Insufficient documentation

## 2019-05-22 DIAGNOSIS — N50811 Right testicular pain: Secondary | ICD-10-CM | POA: Insufficient documentation

## 2019-05-22 DIAGNOSIS — G479 Sleep disorder, unspecified: Secondary | ICD-10-CM | POA: Insufficient documentation

## 2019-05-22 MED ORDER — GABAPENTIN 100 MG PO CAPS: 100.0000 mg | ORAL_CAPSULE | Freq: Every day | ORAL | 1 refills | Status: DC

## 2019-05-22 MED ORDER — AMITRIPTYLINE HCL 10 MG PO TABS: 10.0000 mg | ORAL_TABLET | Freq: Every day | ORAL | 1 refills | Status: DC

## 2019-05-22 NOTE — Progress Notes (Signed)
Subjective:    Patient ID: Lance Garcia, male    DOB: [birth year], 33 y.o.   MRN: [id number]  HPI Male with pmh of kidney stone, ADHD, headache, IBS, anxiety, testicular cysts presents with testicular pain.  Fiance provides history. Started ~10/2018.  He went to the ED on several occasions, most recent notes reviewed.  He saw Urology and was found to have a cyst and a hydrocele, but thought to be unrelated to the pain. Located in b/l testicular and kidneys. Standing, stretching, prolonged ambulation exacerbates the pain.  Laying down improves the pain.  Stabbing/squeezing pain.  Radiates to kidneys from testicles. Constant.  Denies associated numbness. Tylenol does not help.  Denies falls. Pain causes anxiety and limits all activities. He works doing Statistician.    Pain Inventory Average Pain 7 Pain Right Now 3 My pain is sharp, dull, stabbing and aching  In the last 24 hours, has pain interfered with the following? General activity 5 Relation with others 5 Enjoyment of life 9 What TIME of day is your pain at its worst? evening Sleep (in general) Poor  Pain is worse with: walking, bending, standing and some activites Pain improves with: rest Relief from Meds: 0  Mobility walk without assistance ability to climb steps?  yes do you drive?  yes  Function employed # of hrs/week .  Neuro/Psych trouble walking depression anxiety  Prior Studies Any changes since last visit?  no  Physicians involved in your care Any changes since last visit?  no   Family History  Problem Relation Age of Onset  . Neuropathy Mother   . Colon polyps Mother   . Diabetes Mother   . Throat cancer Father    Social History   Socioeconomic History  . Marital status: Single    Spouse name: Not on file  . Number of children: Not on file  . Years of education: Not on file  . Highest education level: Not on file  Occupational History  . Not on file  Social Needs  . Financial resource  strain: Not on file  . Food insecurity    Worry: Not on file    Inability: Not on file  . Transportation needs    Medical: Not on file    Non-medical: Not on file  Tobacco Use  . Smoking status: Current Every Day Smoker    Packs/day: 1.00    Years: 15.00    Pack years: 15.00    Types: Cigarettes  . Smokeless tobacco: Former Engineer, water and Sexual Activity  . Alcohol use: No    Comment: social  . Drug use: Not Currently  . Sexual activity: Not on file  Lifestyle  . Physical activity    Days per week: Not on file    Minutes per session: Not on file  . Stress: Not on file  Relationships  . Social Musician on phone: Not on file    Gets together: Not on file    Attends religious service: Not on file    Active member of club or organization: Not on file    Attends meetings of clubs or organizations: Not on file    Relationship status: Not on file  Other Topics Concern  . Not on file  Social History Narrative  . Not on file   Past Surgical History:  Procedure Laterality Date  . COLONOSCOPY WITH PROPOFOL N/A 12/16/2018   Procedure: COLONOSCOPY WITH PROPOFOL;  Surgeon: Midge Minium, MD;  Location: [facility];  Service: Endoscopy;  Laterality: N/A;  . CYSTOSCOPY WITH STENT PLACEMENT Left 08/03/2016   Procedure: CYSTOSCOPY WITH STENT PLACEMENT;  Surgeon: Hollice Espy, MD;  Location: [facility];  Service: Urology;  Laterality: Left;  . ESOPHAGOGASTRODUODENOSCOPY (EGD) WITH PROPOFOL N/A 12/16/2018   Procedure: ESOPHAGOGASTRODUODENOSCOPY (EGD) WITH PROPOFOL;  Surgeon: Lucilla Lame, MD;  Location: [facility];  Service: Endoscopy;  Laterality: N/A;  . KIDNEY STONE SURGERY    . URETEROSCOPY WITH HOLMIUM LASER LITHOTRIPSY Left 08/03/2016   Procedure: URETEROSCOPY WITH HOLMIUM LASER LITHOTRIPSY;  Surgeon: Hollice Espy, MD;  Location: [facility];  Service: Urology;  Laterality: Left;   Past Medical History:  Diagnosis Date  . Anxiety   . Family history of  adverse reaction to anesthesia    PTS MOM-N/V  . Headache    h/o migraines, approx 2x/yr  . Heat stroke [address] [place]  . History of kidney stones   . Kidney stone    BP (!) 146/68   Pulse 82   Temp 97.9 F (36.6 C)   Ht 5\' 8"  (1.727 m)   Wt 147 lb (66.7 kg)   SpO2 98%   BMI 22.35 kg/m   Opioid Risk Score:   Fall Risk Score:  `1  Depression screen PHQ 2/9  No flowsheet data found.\  Review of Systems  Constitutional: Positive for appetite change and diaphoresis.  HENT: Negative.   Eyes: Negative.   Respiratory: Negative.   Cardiovascular: Negative.   Gastrointestinal: Positive for abdominal pain, constipation, diarrhea and nausea.  Endocrine: Negative.   Genitourinary: Positive for testicular pain.  Musculoskeletal: Negative.   Skin: Negative.   Allergic/Immunologic: Negative.   Neurological: Negative.  Negative for weakness.  Hematological: Negative.   Psychiatric/Behavioral: Negative.       Objective:   Physical Exam Constitutional: No distress . Vital signs reviewed. HENT: Normocephalic.  Atraumatic. Eyes: EOMI. No discharge. Cardiovascular: No JVD. Respiratory: Normal effort.  No stridor. GI: Non-distended. Skin: Warm and dry.  Intact. Psych: Normal mood.  Normal behavior. Musc: No edema in extremities.  No tenderness in extremities. No gross testicular abnormalities  Mild TTP b/l costovertebral Neuro: Alert Motor: B/l LE: HF 4+/5, KE 4-4+/5, ADF 5/5 Sensation intact to light touch b/l LE Reflexes 2+ in b/l LE    Assessment & Plan:  Male with pmh of kidney stone, ADHD, headache, IBS, anxiety, testicular cysts presents with testicular pain.    1. Chronic testicular pain - ?neuropathic +/- radicular +/- anxiety  Will order L-spine xray  Labs reviewed  Referral information reviewed - Urology notes regarding possible causes  PMAWARE reviewed  Encouraged boxer briefs, but in smaller size  Will consider Lidoderm to back  Will  Gabapentin 100 qhs  Will consider Cymbalta  Will consider Robaxin   Patient states main goal is to engage in activities without worry.   Will order Vit D/B12 labs  2. Sleep disturbance  Will order Elavil 10

## 2019-05-25 ENCOUNTER — Ambulatory Visit
Admission: RE | Admit: 2019-05-25 | Discharge: 2019-05-25 | Disposition: A | Discharge status: Home / Self Care | Payer: Self-pay | Source: Ambulatory Visit | Attending: Physical Medicine & Rehabilitation | Admitting: Physical Medicine & Rehabilitation

## 2019-05-25 DIAGNOSIS — N50812 Left testicular pain: Secondary | ICD-10-CM

## 2019-05-25 DIAGNOSIS — N50811 Right testicular pain: Secondary | ICD-10-CM | POA: Insufficient documentation

## 2019-06-15 ENCOUNTER — Encounter: Payer: Self-pay | Admitting: Physical Medicine & Rehabilitation

## 2019-07-11 ENCOUNTER — Encounter: Payer: Self-pay | Admitting: Physical Medicine & Rehabilitation

## 2019-07-17 ENCOUNTER — Other Ambulatory Visit: Payer: Self-pay

## 2019-07-17 ENCOUNTER — Encounter: Payer: Self-pay | Admitting: Physical Medicine & Rehabilitation

## 2019-07-24 ENCOUNTER — Encounter: Payer: Self-pay | Admitting: Physical Medicine & Rehabilitation

## 2019-07-24 ENCOUNTER — Other Ambulatory Visit: Payer: Self-pay

## 2019-07-24 ENCOUNTER — Encounter: Payer: Self-pay | Attending: Physical Medicine & Rehabilitation | Admitting: Physical Medicine & Rehabilitation

## 2019-07-24 VITALS — Ht 68.0 in | Wt 123.0 lb

## 2019-07-24 DIAGNOSIS — N50812 Left testicular pain: Secondary | ICD-10-CM | POA: Insufficient documentation

## 2019-07-24 DIAGNOSIS — G479 Sleep disorder, unspecified: Secondary | ICD-10-CM | POA: Insufficient documentation

## 2019-07-24 DIAGNOSIS — N50811 Right testicular pain: Secondary | ICD-10-CM | POA: Insufficient documentation

## 2019-07-24 MED ORDER — AMITRIPTYLINE HCL 10 MG PO TABS: 10.0000 mg | ORAL_TABLET | Freq: Every day | ORAL | 1 refills | Status: DC

## 2019-07-24 MED ORDER — GABAPENTIN 100 MG PO CAPS: 100.0000 mg | ORAL_CAPSULE | Freq: Two times a day (BID) | ORAL | 1 refills | Status: DC

## 2019-07-24 NOTE — Progress Notes (Signed)
Subjective:    Patient ID: Lance Garcia, male    DOB: 01-Dec-1985, 34 y.o.   MRN: [id number]  TELEHEALTH NOTE  Due to national recommendations of social distancing due to COVID 19, an audio/video telehealth visit is felt to be most appropriate for this patient at this time.  See Chart message from today for the patient's consent to telehealth from [facility].     I verified that I am speaking with the correct person using two identifiers.  Location of patient: Home Location of provider: Office Method of communication: Webex transitioned to telephone due to technical issues Names of participants : Wadie Lessen scheduling, Barbee Shropshire obtaining consent and vitals if available Established patient Time spent on call: 16 minutes  HPI Male with pmh of kidney stone, ADHD, headache, IBS, anxiety, testicular cysts presents for follow up for testicular pain.    Initially stated: Fiance provides history. Started ~10/2018.  He went to the ED on several occasions, most recent notes reviewed.  He saw Urology and was found to have a cyst and a hydrocele, but thought to be unrelated to the pain. Located in b/l testicular and kidneys. Standing, stretching, prolonged ambulation exacerbates the pain.  Laying down improves the pain.  Stabbing/squeezing pain.  Radiates to kidneys from testicles. Constant.  Denies associated numbness. Tylenol does not help.  Denies falls. Pain causes anxiety and limits all activities. He works doing Statistician.    Last clinic visit on 05/22/2019.  Since that time, communication exchanged with patient regarding pain, which is improving.  Fiance supplements history. Male with pmh of kidney stone, ADHD, headache, IBS, anxiety, testicular cysts presents with testicular pain. He obtained xray of his spine, which was unremarkable. He notes improvement in symptoms, but some return in the last week.  No difference in smaller boxer briefs. He did  not obtain labs due to cost.  He has been taking Elavil. Pain is returning around 5-6 PM after work.   Pain Inventory Average Pain 7 Pain Right Now patient did not obtain My pain is sharp, dull, stabbing and aching  In the last 24 hours, has pain interfered with the following? General activity 5 Relation with others 5 Enjoyment of life 9 What TIME of day is your pain at its worst? evening Sleep (in general) Poor  Pain is worse with: walking, bending, standing and some activites Pain improves with: rest Relief from Meds: 0  Mobility walk without assistance ability to climb steps?  yes do you drive?  yes  Function employed # of hrs/week .  Neuro/Psych trouble walking depression anxiety  Prior Studies Any changes since last visit?  no  Physicians involved in your care Any changes since last visit?  no   Family History  Problem Relation Age of Onset  . Neuropathy Mother   . Colon polyps Mother   . Diabetes Mother   . Throat cancer Father    Social History   Socioeconomic History  . Marital status: Single    Spouse name: Not on file  . Number of children: Not on file  . Years of education: Not on file  . Highest education level: Not on file  Occupational History  . Not on file  Tobacco Use  . Smoking status: Current Every Day Smoker    Packs/day: 1.00    Years: 15.00    Pack years: 15.00    Types: Cigarettes  . Smokeless tobacco: Former Engineer, water and Sexual  Activity  . Alcohol use: No    Comment: social  . Drug use: Not Currently  . Sexual activity: Not on file  Other Topics Concern  . Not on file  Social History Narrative  . Not on file   Social Determinants of Health   Financial Resource Strain:   . Difficulty of Paying Living Expenses: Not on file  Food Insecurity:   . Worried About Programme researcher, broadcasting/film/video in the Last Year: Not on file  . Ran Out of Food in the Last Year: Not on file  Transportation Needs:   . Lack of Transportation  (Medical): Not on file  . Lack of Transportation (Non-Medical): Not on file  Physical Activity:   . Days of Exercise per Week: Not on file  . Minutes of Exercise per Session: Not on file  Stress:   . Feeling of Stress : Not on file  Social Connections:   . Frequency of Communication with Friends and Family: Not on file  . Frequency of Social Gatherings with Friends and Family: Not on file  . Attends Religious Services: Not on file  . Active Member of Clubs or Organizations: Not on file  . Attends Banker Meetings: Not on file  . Marital Status: Not on file   Past Surgical History:  Procedure Laterality Date  . COLONOSCOPY WITH PROPOFOL N/A 12/16/2018   Procedure: COLONOSCOPY WITH PROPOFOL;  Surgeon: Midge Minium, MD;  Location: [facility];  Service: Endoscopy;  Laterality: N/A;  . CYSTOSCOPY WITH STENT PLACEMENT Left 08/03/2016   Procedure: CYSTOSCOPY WITH STENT PLACEMENT;  Surgeon: Vanna Scotland, MD;  Location: [facility];  Service: Urology;  Laterality: Left;  . ESOPHAGOGASTRODUODENOSCOPY (EGD) WITH PROPOFOL N/A 12/16/2018   Procedure: ESOPHAGOGASTRODUODENOSCOPY (EGD) WITH PROPOFOL;  Surgeon: Midge Minium, MD;  Location: [facility];  Service: Endoscopy;  Laterality: N/A;  . KIDNEY STONE SURGERY    . URETEROSCOPY WITH HOLMIUM LASER LITHOTRIPSY Left 08/03/2016   Procedure: URETEROSCOPY WITH HOLMIUM LASER LITHOTRIPSY;  Surgeon: Vanna Scotland, MD;  Location: [facility];  Service: Urology;  Laterality: Left;   Past Medical History:  Diagnosis Date  . Anxiety   . Family history of adverse reaction to anesthesia    PTS MOM-N/V  . Headache    h/o migraines, approx 2x/yr  . Heat stroke [address], [place]  . History of kidney stones   . Kidney stone    Ht 5\' 8"  (1.727 m)   Wt 123 lb (55.8 kg)   BMI 18.70 kg/m   Opioid Risk Score:   Fall Risk Score:  `1  Depression screen PHQ 2/9  No flowsheet data found.\  Review of Systems    Constitutional: Positive for appetite change and diaphoresis.  HENT: Negative.   Eyes: Negative.   Respiratory: Negative.   Cardiovascular: Negative.   Gastrointestinal: Positive for abdominal pain, constipation, diarrhea and nausea.  Endocrine: Negative.   Genitourinary: Positive for testicular pain.  Musculoskeletal: Negative.   Skin: Negative.   Allergic/Immunologic: Negative.   Hematological: Negative.   Psychiatric/Behavioral: Negative.       Objective:   Physical Exam Constitutional: No distress  Respiratory: Normal effort. Psych: Normal mood.  Normal behavior. Neuro: Alert    Assessment & Plan:  Male with pmh of kidney stone, ADHD, headache, IBS, anxiety, testicular cysts presents for follow up for testicular pain.    1. Chronic testicular pain - ?neuropathic +/- radicular +/- anxiety  L-spine xray personally reviewed, unremarkable  Referral information  reviewed previously- Urology notes regarding possible causes  Encouraged smaller size boxer briefs-no change  Will consider Lidoderm to back  Will increase gabapentin to 100 twice daily  Will consider Cymbalta  Will consider Robaxin   Patient states main goal is to engage in activities without worry-some improvement.   Will order Vit D/B12 labs-patient did not obtain due to financial restraints  2. Sleep disturbance  Continue Elavil 10  Some improvement

## 2019-08-21 ENCOUNTER — Encounter: Payer: Self-pay | Attending: Physical Medicine & Rehabilitation | Admitting: Physical Medicine & Rehabilitation

## 2019-08-21 ENCOUNTER — Other Ambulatory Visit: Payer: Self-pay

## 2019-08-21 ENCOUNTER — Encounter: Payer: Self-pay | Admitting: Physical Medicine & Rehabilitation

## 2019-08-21 VITALS — Ht 68.0 in | Wt 163.0 lb

## 2019-08-21 DIAGNOSIS — N50811 Right testicular pain: Secondary | ICD-10-CM

## 2019-08-21 DIAGNOSIS — N50812 Left testicular pain: Secondary | ICD-10-CM

## 2019-08-21 DIAGNOSIS — G479 Sleep disorder, unspecified: Secondary | ICD-10-CM

## 2019-08-21 MED ORDER — AMITRIPTYLINE HCL 10 MG PO TABS: 10.0000 mg | ORAL_TABLET | Freq: Every day | ORAL | 1 refills | Status: DC

## 2019-08-21 MED ORDER — GABAPENTIN 100 MG PO CAPS: 100.0000 mg | ORAL_CAPSULE | Freq: Two times a day (BID) | ORAL | 1 refills | Status: DC

## 2019-08-21 NOTE — Progress Notes (Signed)
Subjective:    Patient ID: Lance Garcia, male    DOB: June 11, 1986, 34 y.o.   MRN: [id number]  TELEHEALTH NOTE  Due to national recommendations of social distancing due to COVID 19, an audio/video telehealth visit is felt to be most appropriate for this patient at this time.  See Chart message from today for the patient's consent to telehealth from [facility].     I verified that I am speaking with the correct person using two identifiers.  Location of patient: Home Location of provider: Office Method of communication: Webex transitioned to telephone due to technical issues Names of participants : Wadie Lessen scheduling, Silas Sacramento obtaining consent and vitals if available Established patient  HPI Male with pmh of kidney stone, ADHD, headache, IBS, anxiety, testicular cysts presents for follow up for testicular pain.    Initially stated: Fiance provides history. Started ~10/2018.  He went to the ED on several occasions, most recent notes reviewed.  He saw Urology and was found to have a cyst and a hydrocele, but thought to be unrelated to the pain. Located in b/l testicular and kidneys. Standing, stretching, prolonged ambulation exacerbates the pain.  Laying down improves the pain.  Stabbing/squeezing pain.  Radiates to kidneys from testicles. Constant.  Denies associated numbness. Tylenol does not help.  Denies falls. Pain causes anxiety and limits all activities. 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  Fiance supplements history. Since that time, he states good benefit with increase in Gabapentin.  He has improved his ability to engage in enjoyable activities. Sleep has improved.   Pain Inventory Average Pain 4 Pain Right Now 4 My pain is dull and aching  In the last 24 hours, has pain interfered with the following? General activity 5 Relation with others 5 Enjoyment of life 9 What TIME of day is your pain at its  worst? evening Sleep (in general) Fair  Pain is worse with: walking, bending, standing and some activites Pain improves with: rest Relief from Meds: 0  Mobility walk without assistance ability to climb steps?  yes do you drive?  yes  Function employed # of hrs/week .  Neuro/Psych trouble walking depression anxiety  Prior Studies Any changes since last visit?  no  Physicians involved in your care Any changes since last visit?  no   Family History  Problem Relation Age of Onset  . Neuropathy Mother   . Colon polyps Mother   . Diabetes Mother   . Throat cancer Father    Social History   Socioeconomic History  . Marital status: Single    Spouse name: Not on file  . Number of children: Not on file  . Years of education: Not on file  . Highest education level: Not on file  Occupational History  . Not on file  Tobacco Use  . Smoking status: Current Every Day Smoker    Packs/day: 1.00    Years: 15.00    Pack years: 15.00    Types: Cigarettes  . Smokeless tobacco: Former Engineer, water and Sexual Activity  . Alcohol use: No    Comment: social  . Drug use: Not Currently  . Sexual activity: Not on file  Other Topics Concern  . Not on file  Social History Narrative  . Not on file   Social Determinants of Health   Financial Resource Strain:   . Difficulty of Paying Living Expenses: Not on file  Food Insecurity:   . Worried About Charity fundraiser in the Last Year: Not on file  . Ran Out of Food in the Last Year: Not on file  Transportation Needs:   . Lack of Transportation (Medical): Not on file  . Lack of Transportation (Non-Medical): Not on file  Physical Activity:   . Days of Exercise per Week: Not on file  . Minutes of Exercise per Session: Not on file  Stress:   . Feeling of Stress : Not on file  Social Connections:   . Frequency of Communication with Friends and Family: Not on file  . Frequency of Social Gatherings with Friends and Family: Not  on file  . Attends Religious Services: Not on file  . Active Member of Clubs or Organizations: Not on file  . Attends Archivist Meetings: Not on file  . Marital Status: Not on file   Past Surgical History:  Procedure Laterality Date  . COLONOSCOPY WITH PROPOFOL N/A 12/16/2018   Procedure: COLONOSCOPY WITH PROPOFOL;  Surgeon: Lucilla Lame, MD;  Location: [facility];  Service: Endoscopy;  Laterality: N/A;  . CYSTOSCOPY WITH STENT PLACEMENT Left 08/03/2016   Procedure: CYSTOSCOPY WITH STENT PLACEMENT;  Surgeon: Hollice Espy, MD;  Location: [facility];  Service: Urology;  Laterality: Left;  . ESOPHAGOGASTRODUODENOSCOPY (EGD) WITH PROPOFOL N/A 12/16/2018   Procedure: ESOPHAGOGASTRODUODENOSCOPY (EGD) WITH PROPOFOL;  Surgeon: Lucilla Lame, MD;  Location: [place];  Service: Endoscopy;  Laterality: N/A;  . KIDNEY STONE SURGERY    . URETEROSCOPY WITH HOLMIUM LASER LITHOTRIPSY Left 08/03/2016   Procedure: URETEROSCOPY WITH HOLMIUM LASER LITHOTRIPSY;  Surgeon: Hollice Espy, MD;  Location: [facility];  Service: Urology;  Laterality: Left;   Past Medical History:  Diagnosis Date  . Anxiety   . Family history of adverse reaction to anesthesia    PTS MOM-N/V  . Headache    h/o migraines, approx 2x/yr  . Heat stroke [address] [place]  . History of kidney stones   . Kidney stone    Ht 5\' 8"  (1.727 m)   Wt 163 lb (73.9 kg)   BMI 24.78 kg/m   Opioid Risk Score:   Fall Risk Score:  `1  Depression screen PHQ 2/9  No flowsheet data found.\  Review of Systems  Constitutional: Positive for appetite change and diaphoresis.  HENT: Negative.   Eyes: Negative.   Respiratory: Negative.   Cardiovascular: Negative.   Gastrointestinal: Positive for abdominal pain, constipation, diarrhea and nausea.  Endocrine: Negative.   Genitourinary: Positive for testicular pain.  Musculoskeletal: Negative.   Skin: Negative.   Allergic/Immunologic: Negative.     Hematological: Negative.   Psychiatric/Behavioral: Negative.       Objective:   Physical Exam Constitutional: No distress  Respiratory: Normal effort. Psych: Normal mood.  Normal behavior. Neuro: Alert    Assessment & Plan:  Male with pmh of kidney stone, ADHD, headache, IBS, anxiety, testicular cysts presents for follow up for testicular pain.    1. 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 Sleep disturbance  Continue Elavil 10  Some improvement

## 2019-09-17 ENCOUNTER — Other Ambulatory Visit: Payer: Self-pay | Admitting: Physical Medicine & Rehabilitation

## 2019-09-18 ENCOUNTER — Other Ambulatory Visit: Payer: Self-pay | Admitting: *Deleted

## 2019-09-18 MED ORDER — GABAPENTIN 100 MG PO CAPS: 100.0000 mg | ORAL_CAPSULE | Freq: Two times a day (BID) | ORAL | 1 refills | Status: DC

## 2019-11-14 ENCOUNTER — Telehealth: Payer: Self-pay | Admitting: Physical Medicine & Rehabilitation

## 2019-11-14 NOTE — Telephone Encounter (Signed)
That's fine.  Should be Webex.  Thanks.

## 2019-11-14 NOTE — Telephone Encounter (Signed)
Lance Garcia is calling for the patient to see if we can make him my chart visit with you.  Patient has went back to work and can't get off for this appointment.  Please let me know if we can do that.  I will set up if this is ok.  His gabapentin will run out on 5/11 just in case we can't do video visit, he will need a refill.

## 2019-11-20 ENCOUNTER — Encounter: Payer: Self-pay | Admitting: Physical Medicine & Rehabilitation

## 2019-11-20 ENCOUNTER — Other Ambulatory Visit: Payer: Self-pay

## 2019-11-20 ENCOUNTER — Encounter: Payer: Self-pay | Attending: Physical Medicine & Rehabilitation | Admitting: Physical Medicine & Rehabilitation

## 2019-11-20 VITALS — Ht 68.0 in | Wt 167.0 lb

## 2019-11-20 DIAGNOSIS — G479 Sleep disorder, unspecified: Secondary | ICD-10-CM | POA: Insufficient documentation

## 2019-11-20 DIAGNOSIS — N50812 Left testicular pain: Secondary | ICD-10-CM | POA: Insufficient documentation

## 2019-11-20 DIAGNOSIS — G8929 Other chronic pain: Secondary | ICD-10-CM

## 2019-11-20 DIAGNOSIS — N50811 Right testicular pain: Secondary | ICD-10-CM | POA: Insufficient documentation

## 2019-11-20 MED ORDER — GABAPENTIN 100 MG PO CAPS: 100.0000 mg | ORAL_CAPSULE | Freq: Three times a day (TID) | ORAL | 1 refills | Status: DC

## 2019-11-20 NOTE — Progress Notes (Signed)
Subjective:    Patient ID: Lance Garcia, male    DOB: [birth year], 34 y.o.   MRN: [id number]  TELEHEALTH NOTE  Due to national recommendations of social distancing due to COVID 19, an audio/video telehealth visit is felt to be most appropriate for this patient at this time.  See Chart message from today for the patient's consent to telehealth from [place].     I verified that I am speaking with the correct person using two identifiers.  Location of patient: Home Location of provider: Office Method of communication: Webex Names of participants : Zorita Pang scheduling, Marland Mcalpine obtaining consent and vitals if available Established patient  HPI Male with pmh of kidney stone, ADHD, headache, IBS, anxiety, testicular cysts presents for follow up for testicular pain.    Initially stated: Fiance provides history. Started ~10/2018.  He went to the ED on several occasions, most recent notes reviewed.  He saw Urology and was found to have a cyst and a hydrocele, but thought to be unrelated to the pain. Located in b/l testicular and kidneys. Standing, stretching, prolonged ambulation exacerbates the pain.  Laying down improves the pain.  Stabbing/squeezing pain.  Radiates to kidneys from testicles. Constant.  Denies associated numbness. Tylenol does not help.  Denies falls. Pain causes anxiety and limits all activities. He works doing Agricultural engineer.    Last clinic visit on 08/21/19.  Since that time, pt states he continues to take Gabapentin 100 BID.  He states he is doing more enjoyable activities.  Patient has not obtain Vit D/B12 labs, due to financial constraints.  He stopped taking Elavil due headaches.   Pain Inventory Average Pain 2 Pain Right Now 0 My pain is no pain  In the last 24 hours, has pain interfered with the following? General activity 0 Relation with others 0 Enjoyment of life 0 What TIME of day is your pain at its worst? no  pain Sleep (in general) Fair  Pain is worse with: no pain Pain improves with: rest and no pain Relief from Meds: 0  Mobility walk without assistance ability to climb steps?  yes do you drive?  yes  Function employed # of hrs/week 40  Neuro/Psych depression anxiety  Prior Studies Any changes since last visit?  no  Physicians involved in your care Any changes since last visit?  no   Family History  Problem Relation Age of Onset  . Neuropathy Mother   . Colon polyps Mother   . Diabetes Mother   . Throat cancer Father    Social History   Socioeconomic History  . Marital status: Single    Spouse name: Not on file  . Number of children: Not on file  . Years of education: Not on file  . Highest education level: Not on file  Occupational History  . Not on file  Tobacco Use  . Smoking status: Current Every Day Smoker    Packs/day: 1.00    Years: 15.00    Pack years: 15.00    Types: Cigarettes  . Smokeless tobacco: Former Network engineer and Sexual Activity  . Alcohol use: No    Comment: social  . Drug use: Not Currently  . Sexual activity: Not on file  Other Topics Concern  . Not on file  Social History Narrative  . Not on file   Social Determinants of Health   Financial Resource Strain:   . Difficulty of Paying Living Expenses:  Food Insecurity:   . Worried About Programme researcher, broadcasting/film/video in the Last Year:   . Barista in the Last Year:   Transportation Needs:   . Freight forwarder (Medical):   Marland Kitchen Lack of Transportation (Non-Medical):   Physical Activity:   . Days of Exercise per Week:   . Minutes of Exercise per Session:   Stress:   . Feeling of Stress :   Social Connections:   . Frequency of Communication with Friends and Family:   . Frequency of Social Gatherings with Friends and Family:   . Attends Religious Services:   . Active Member of Clubs or Organizations:   . Attends Banker Meetings:   Marland Kitchen Marital Status:    Past  Surgical History:  Procedure Laterality Date  . COLONOSCOPY WITH PROPOFOL N/A 12/16/2018   Procedure: COLONOSCOPY WITH PROPOFOL;  Surgeon: Midge Minium, MD;  Location: [facility];  Service: Endoscopy;  Laterality: N/A;  . CYSTOSCOPY WITH STENT PLACEMENT Left 08/03/2016   Procedure: CYSTOSCOPY WITH STENT PLACEMENT;  Surgeon: Vanna Scotland, MD;  Location: [facility];  Service: Urology;  Laterality: Left;  . ESOPHAGOGASTRODUODENOSCOPY (EGD) WITH PROPOFOL N/A 12/16/2018   Procedure: ESOPHAGOGASTRODUODENOSCOPY (EGD) WITH PROPOFOL;  Surgeon: Midge Minium, MD;  Location: [facility];  Service: Endoscopy;  Laterality: N/A;  . KIDNEY STONE SURGERY    . URETEROSCOPY WITH HOLMIUM LASER LITHOTRIPSY Left 08/03/2016   Procedure: URETEROSCOPY WITH HOLMIUM LASER LITHOTRIPSY;  Surgeon: Vanna Scotland, MD;  Location: [facility];  Service: Urology;  Laterality: Left;   Past Medical History:  Diagnosis Date  . Anxiety   . Family history of adverse reaction to anesthesia    PTS MOM-N/V  . Headache    h/o migraines, approx 2x/yr  . Heat stroke [address], [place]  . History of kidney stones   . Kidney stone    Ht 5\' 8"  (1.727 m)   Wt 167 lb (75.8 kg)   BMI 25.39 kg/m   Opioid Risk Score:   Fall Risk Score:  `1  Depression screen PHQ 2/9  No flowsheet data found.\  Review of Systems  Constitutional: Negative.   HENT: Negative.   Eyes: Negative.   Respiratory: Negative.   Cardiovascular: Negative.   Gastrointestinal: Positive for abdominal pain and constipation.  Endocrine: Negative.   Genitourinary: Positive for flank pain and testicular pain.  Skin: Negative.   Allergic/Immunologic: Negative.   Hematological: Negative.   Psychiatric/Behavioral: Negative.       Objective:   Physical Exam Constitutional: No distress  Respiratory: Normal effort. Psych: Normal mood.  Normal behavior. Neuro: Alert    Assessment & Plan:  Male with pmh of kidney stone, ADHD, headache,  IBS, anxiety, testicular cysts presents for follow up for testicular pain.    1. Chronic testicular pain - ?neuropathic +/- radicular +/- anxiety  L-spine xray personally reviewed, unremarkable  Encouraged smaller size boxer briefs-no change  Will consider Lidoderm to back  Increase gabapentin to 100 TID - good benefit after initiation  Will consider Cymbalta  Will consider Robaxin   Patient states main goal is to engage in activities without worry- which is improving  Ordered Vit D/B12 labs-patient did not obtain due to financial restraints, encouraged to attempt to obtain labs again  2. Sleep disturbance  Elavil 10 causes headaches  Encouraged Melatonin   Educated on sleep hygiene  Some improvement

## 2020-01-18 ENCOUNTER — Encounter: Payer: Self-pay | Attending: Physical Medicine & Rehabilitation | Admitting: Physical Medicine & Rehabilitation

## 2020-01-18 ENCOUNTER — Encounter: Payer: Self-pay | Admitting: Physical Medicine & Rehabilitation

## 2020-01-18 VITALS — BP 132/84 | HR 74 | Ht 68.0 in | Wt 162.0 lb

## 2020-01-18 DIAGNOSIS — N50811 Right testicular pain: Secondary | ICD-10-CM | POA: Insufficient documentation

## 2020-01-18 DIAGNOSIS — G479 Sleep disorder, unspecified: Secondary | ICD-10-CM | POA: Insufficient documentation

## 2020-01-18 DIAGNOSIS — N50812 Left testicular pain: Secondary | ICD-10-CM | POA: Insufficient documentation

## 2020-01-18 MED ORDER — GABAPENTIN 100 MG PO CAPS: 100.0000 mg | ORAL_CAPSULE | Freq: Three times a day (TID) | ORAL | 5 refills | Status: DC

## 2020-01-18 NOTE — Progress Notes (Signed)
Subjective:    Patient ID: Lance Garcia, male    DOB: Mar 11, 1986, 34 y.o.   MRN: [id number]  TELEHEALTH NOTE  Due to national recommendations of social distancing due to COVID 19, an audio/video telehealth visit is felt to be most appropriate for this patient at this time.  See Chart message from today for the patient's consent to telehealth from [facility].     I verified that I am speaking with the correct person using two identifiers.  Location of patient: Work Government social research officer of provider: Office Method of communication: My chart video visit Names of participants : Wadie Lessen scheduling, Silas [place] obtaining consent and vitals if available Established patient  HPI Male with pmh of kidney stone, ADHD, headache, IBS, anxiety, testicular cysts presents for follow up for testicular pain.    Initially stated: Fiance provides history. Started ~10/2018.  He went to the ED on several occasions, most recent notes reviewed.  He saw Urology and was found to have a cyst and a hydrocele, but thought to be unrelated to the pain. Located in b/l testicular and kidneys. Standing, stretching, prolonged ambulation exacerbates the pain.  Laying down improves the pain.  Stabbing/squeezing pain.  Radiates to kidneys from testicles. Constant.  Denies associated numbness. Tylenol does not help.  Denies falls. Pain causes anxiety and limits all activities. He works doing Statistician.    Last clinic visit on 11/20/19.  Since that time, communication exchanged regarding medications and efficacy. He states he is tolerating work. He notes good improvement with Gabapentin, with occasional pain only. He is able to do more enjoyable activities now. He has not gotten lab work done. Sleep is fair.   Pain Inventory Average Pain 2 Pain Right Now 0 My pain is no pain  In the last 24 hours, has pain interfered with the following? General activity 0 Relation with others 0 Enjoyment  of life 0 What TIME of day is your pain at its worst? no pain Sleep (in general) Fair  Pain is worse with: no pain Pain improves with: rest and no pain Relief from Meds: 0  Mobility walk without assistance ability to climb steps?  yes do you drive?  yes  Function employed # of hrs/week 40  Neuro/Psych depression anxiety  Prior Studies Any changes since last visit?  no  Physicians involved in your care Any changes since last visit?  no   Family History  Problem Relation Age of Onset   Neuropathy Mother    Colon polyps Mother    Diabetes Mother    Throat cancer Father    Social History   Socioeconomic History   Marital status: Single    Spouse name: Not on file   Number of children: Not on file   Years of education: Not on file   Highest education level: Not on file  Occupational History   Not on file  Tobacco Use   Smoking status: Current Every Day Smoker    Packs/day: 1.00    Years: 15.00    Pack years: 15.00    Types: Cigarettes   Smokeless tobacco: Former Forensic psychologist Use: Never used  Substance and Sexual Activity   Alcohol use: No    Comment: social   Drug use: Not Currently   Sexual activity: Not on file  Other Topics Concern   Not on file  Social History Narrative   Not on file   Social Determinants of  Health   Financial Resource Strain:    Difficulty of Paying Living Expenses:   Food Insecurity:    Worried About Programme researcher, broadcasting/film/video in the Last Year:    Barista in the Last Year:   Transportation Needs:    Freight forwarder (Medical):    Lack of Transportation (Non-Medical):   Physical Activity:    Days of Exercise per Week:    Minutes of Exercise per Session:   Stress:    Feeling of Stress :   Social Connections:    Frequency of Communication with Friends and Family:    Frequency of Social Gatherings with Friends and Family:    Attends Religious Services:    Active Member of  Clubs or Organizations:    Attends Banker Meetings:    Marital Status:    Past Surgical History:  Procedure Laterality Date   COLONOSCOPY WITH PROPOFOL N/A 12/16/2018   Procedure: COLONOSCOPY WITH PROPOFOL;  Surgeon: Midge Minium, MD;  Location: [facility];  Service: Endoscopy;  Laterality: N/A;   CYSTOSCOPY WITH STENT PLACEMENT Left 08/03/2016   Procedure: CYSTOSCOPY WITH STENT PLACEMENT;  Surgeon: Vanna Scotland, MD;  Location: [facility];  Service: Urology;  Laterality: Left;   ESOPHAGOGASTRODUODENOSCOPY (EGD) WITH PROPOFOL N/A 12/16/2018   Procedure: ESOPHAGOGASTRODUODENOSCOPY (EGD) WITH PROPOFOL;  Surgeon: Midge Minium, MD;  Location: [facility];  Service: Endoscopy;  Laterality: N/A;   KIDNEY STONE SURGERY     URETEROSCOPY WITH HOLMIUM LASER LITHOTRIPSY Left 08/03/2016   Procedure: URETEROSCOPY WITH HOLMIUM LASER LITHOTRIPSY;  Surgeon: Vanna Scotland, MD;  Location: [facility];  Service: Urology;  Laterality: Left;   Past Medical History:  Diagnosis Date   Anxiety    Family history of adverse reaction to anesthesia    PTS MOM-N/V   Headache    h/o migraines, approx 2x/yr   Heat stroke 2014   [facility], [place]   History of kidney stones    Kidney stone    BP 132/84 Comment: pt reported, video visit   Pulse 74 Comment: pt reported, video visit   Ht 5\' 8"  (1.727 m) Comment: pt reported, video visit   Wt 162 lb (73.5 kg) Comment: pt reported, video visit   BMI 24.63 kg/m   Opioid Risk Score:   Fall Risk Score:  `1  Depression screen PHQ 2/9  Depression screen PHQ 2/9 01/18/2020  Decreased Interest 1  Down, Depressed, Hopeless 1  PHQ - 2 Score 2  \  Review of Systems  Constitutional: Negative.   HENT: Negative.   Eyes: Negative.   Respiratory: Negative.   Cardiovascular: Negative.   Gastrointestinal: Positive for abdominal pain and constipation.  Endocrine: Negative.   Genitourinary: Positive for flank pain and testicular pain.    Skin: Negative.   Allergic/Immunologic: Negative.   Hematological: Negative.   Psychiatric/Behavioral: Negative.       Objective:   Physical Exam Constitutional: NAD. Respiratory: Normal effort. Psych: Normal mood.  Normal behavior. Neuro: Alert    Assessment & Plan:  Male with pmh of kidney stone, ADHD, headache, IBS, anxiety, testicular cysts presents for follow up for testicular pain.     1. 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 Sleep disturbance  Contributing to #1, see above             Elavil 10 causes headaches             Continue Melatonin              Educated on sleep hygiene             Fair, some improvement

## 2020-01-25 ENCOUNTER — Ambulatory Visit: Payer: Self-pay | Admitting: Physical Medicine & Rehabilitation

## 2020-03-06 IMAGING — US ULTRASOUND SCROTUM DOPPLER COMPLETE
1 series · 14 of 25 positions shown · non-contrast
Comparison: None.

CLINICAL DATA: Left testicular pain for 2-3 weeks.

EXAM:
SCROTAL ULTRASOUND
DOPPLER ULTRASOUND OF THE TESTICLES
TECHNIQUE: Complete ultrasound examination of the testicles, epididymis, and
other scrotal structures was performed. Color and spectral Doppler
ultrasound were also utilized to evaluate blood flow to the
testicles.

[Series 1: ultrasound scrotum doppler complete · 14 of 69 slices shown]
[im 1/69]
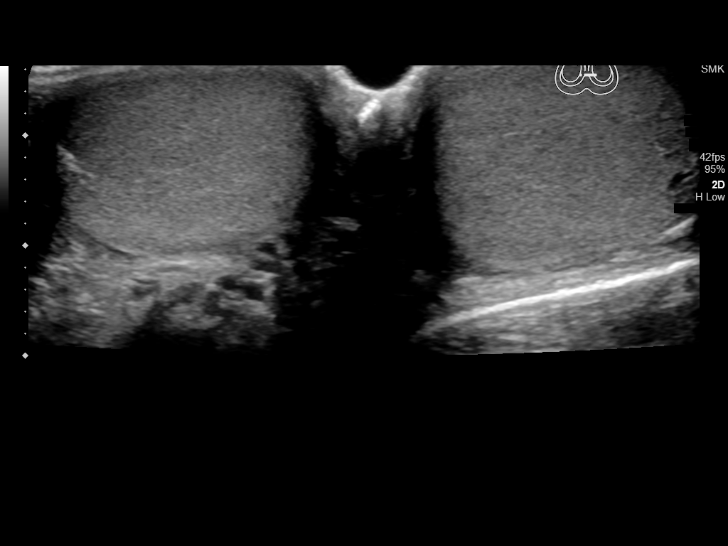
[im 6/69]
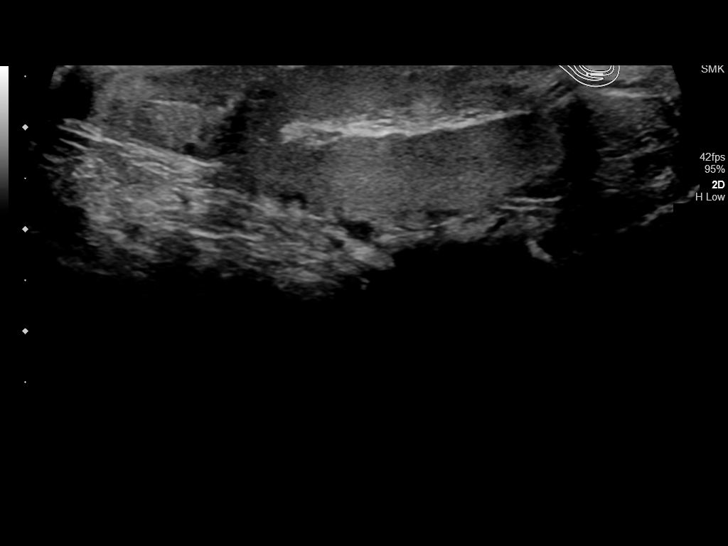
[im 12/69]
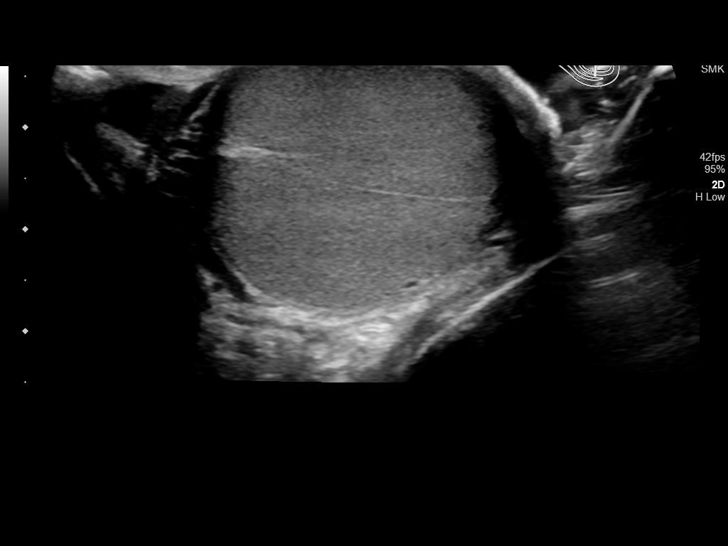
[im 18/69]
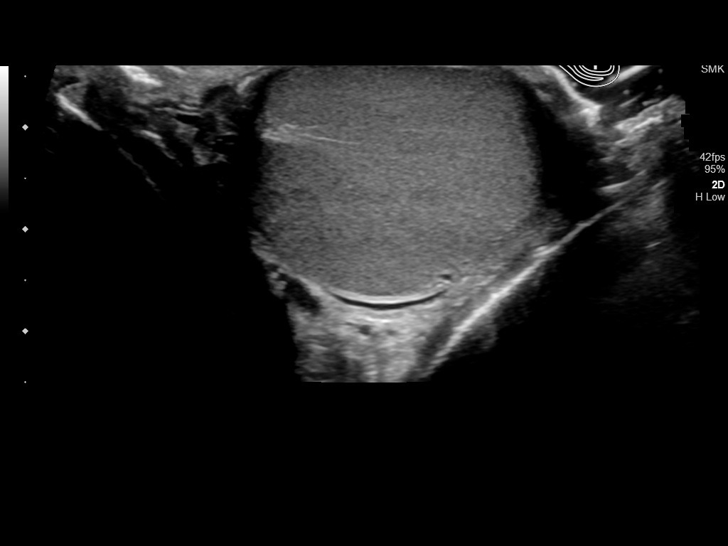
[im 23/69]
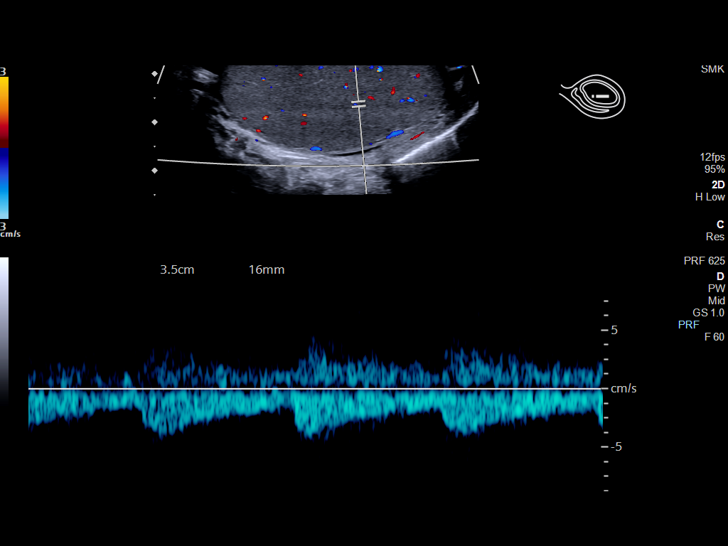
[im 26/69]
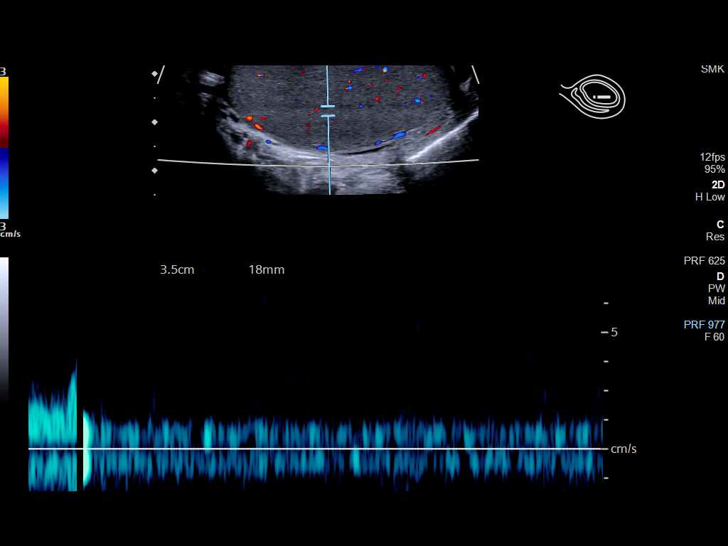
[im 32/69]
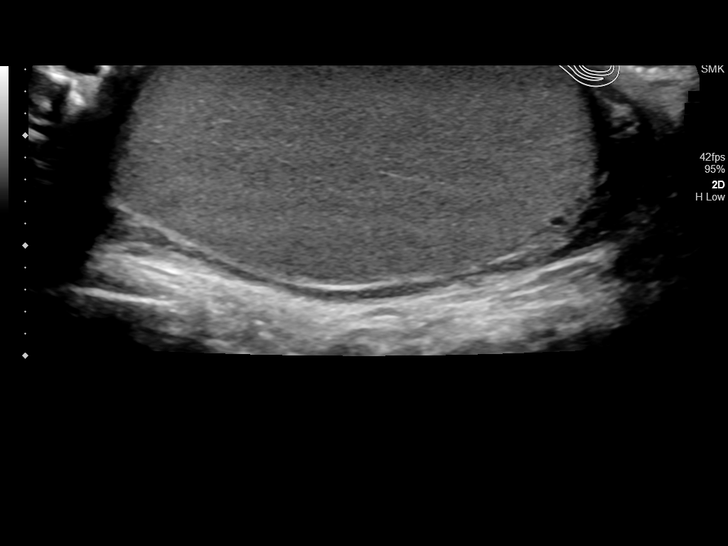
[im 37/69]
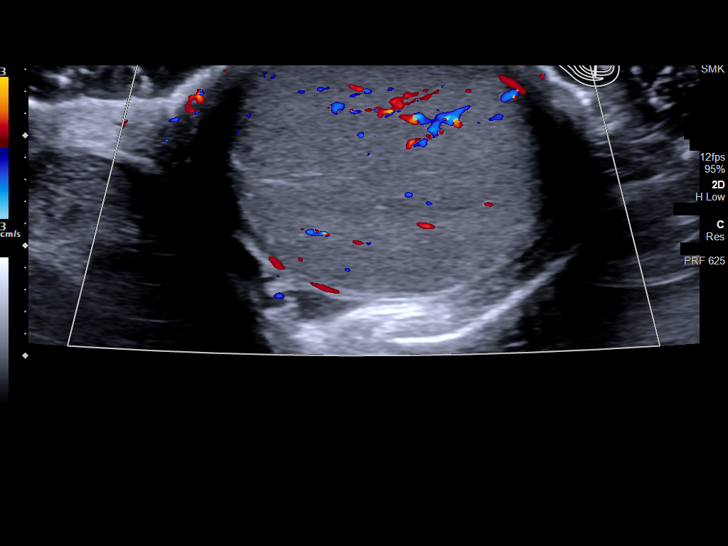
[im 43/69]
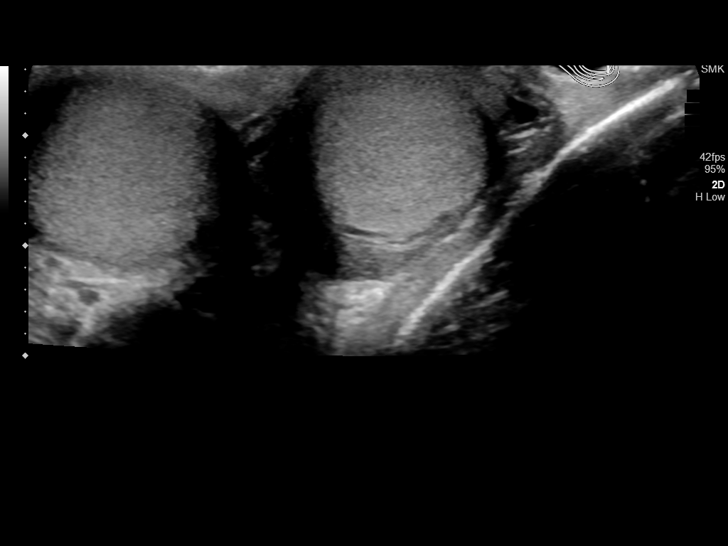
[im 46/69]
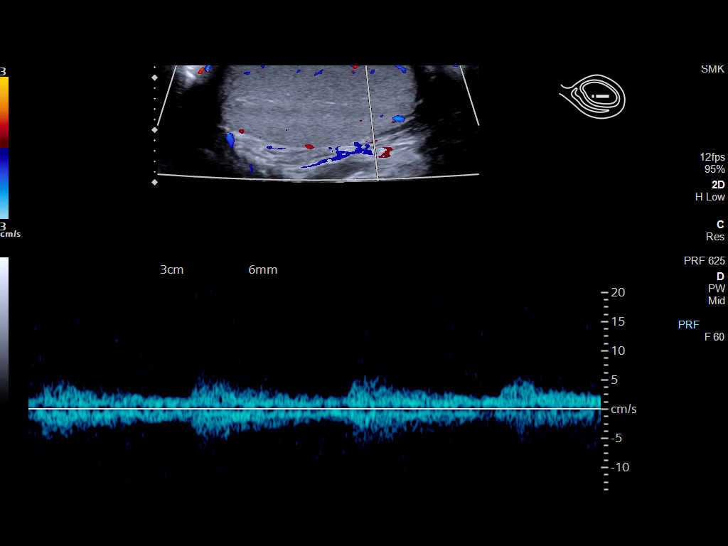
[im 52/69]
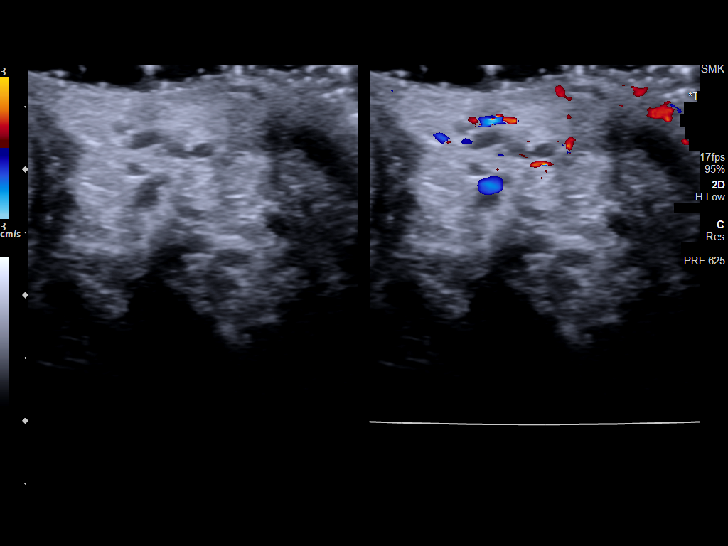
[im 57/69]
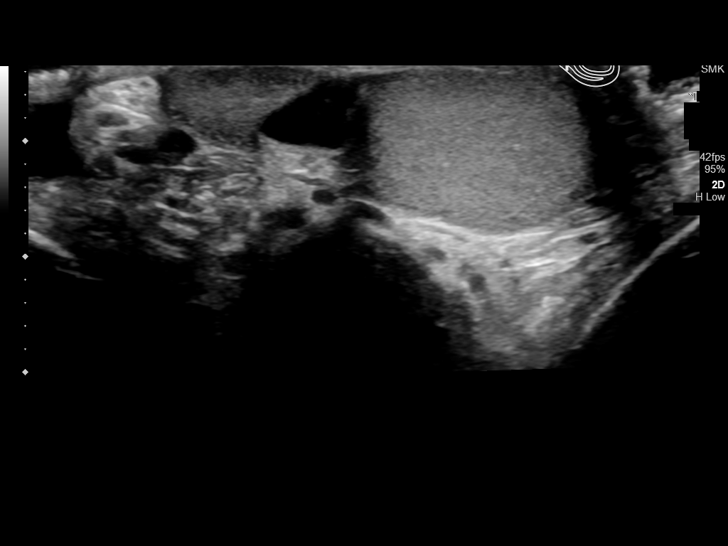
[im 63/69]
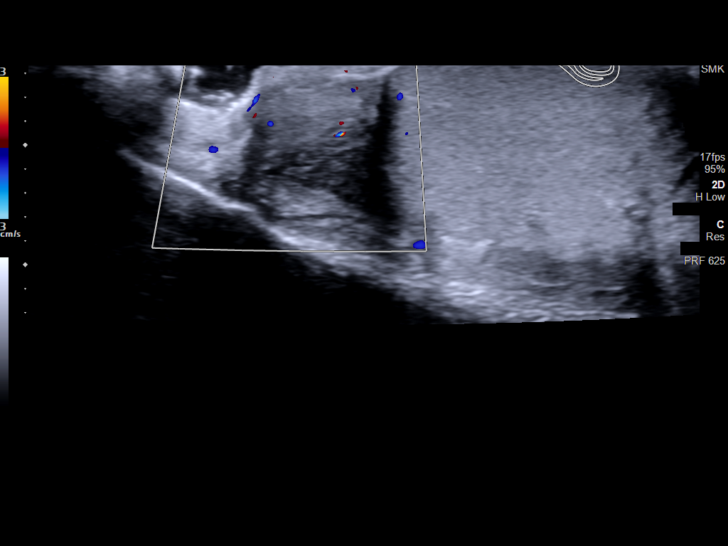
[im 69/69]
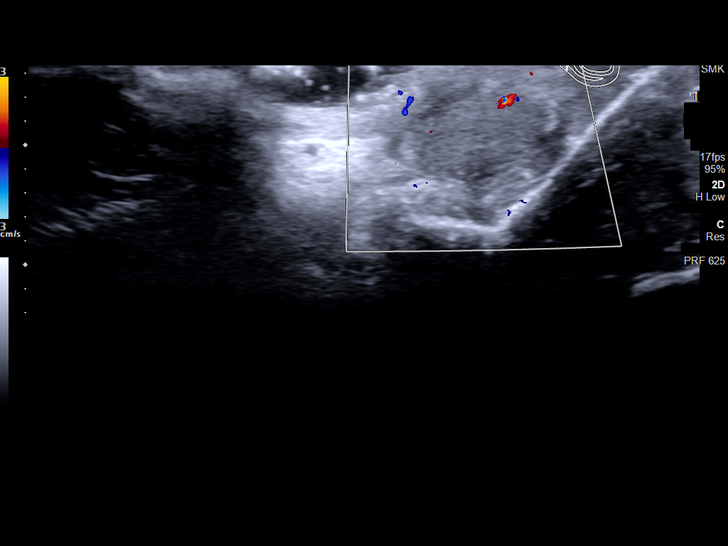

[14 of 25 positions shown; findings below may reference images not displayed]

FINDINGS: Right testicle

Measurements: 4.9 x 2.2 x 2.8 cm. No mass or microlithiasis
visualized.

Left testicle

Measurements: 4.5 x 2.1 x 3.0 cm. No mass or microlithiasis
visualized.

Right epididymis:  10 x 7 x 8 mm cyst at the epididymal head.

Left epididymis:  Normal in size and appearance.

Hydrocele:  No significant fluid.

Varicocele:  None visualized.

Pulsed Doppler interrogation of both testes demonstrates normal low
resistance arterial and venous waveforms bilaterally.
IMPRESSION: 1. Unremarkable appearance of the testicles. No acute abnormality
identified.
2. 10 mm right epididymal cyst or spermatocele.

## 2020-04-25 ENCOUNTER — Other Ambulatory Visit: Payer: Self-pay | Admitting: Gastroenterology

## 2020-04-29 ENCOUNTER — Other Ambulatory Visit: Payer: Self-pay | Admitting: Gastroenterology

## 2020-06-04 ENCOUNTER — Other Ambulatory Visit: Payer: Self-pay | Admitting: Gastroenterology

## 2020-06-08 IMAGING — US ULTRASOUND SCROTUM DOPPLER COMPLETE
1 series · 14 of 25 positions shown · non-contrast
Comparison: None.

CLINICAL DATA: 32-year-old male with right testicular pain x3
months.

EXAM:
SCROTAL ULTRASOUND
DOPPLER ULTRASOUND OF THE TESTICLES
TECHNIQUE: Complete ultrasound examination of the testicles, epididymis, and
other scrotal structures was performed. Color and spectral Doppler
ultrasound were also utilized to evaluate blood flow to the
testicles.

[Series 1: ultrasound scrotum doppler complete · 14 of 63 slices shown]
[im 1/63]
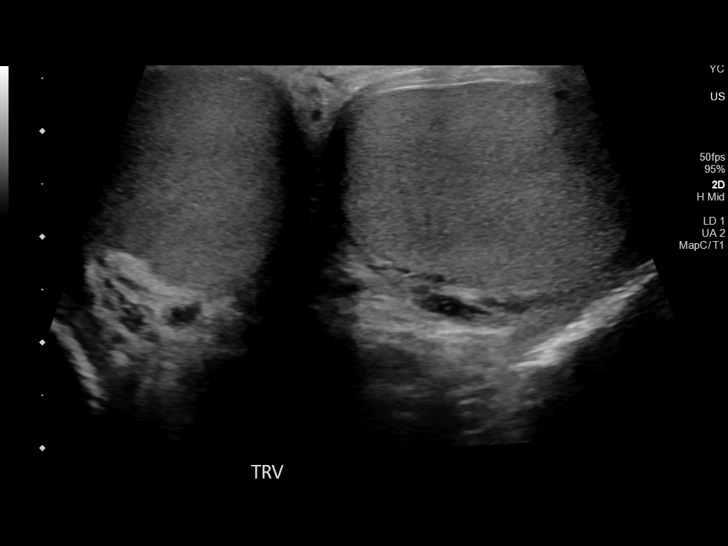
[im 6/63]
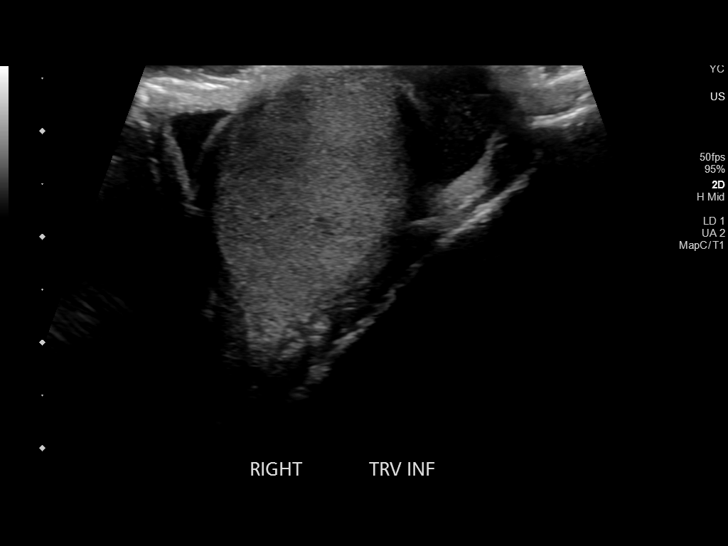
[im 11/63]
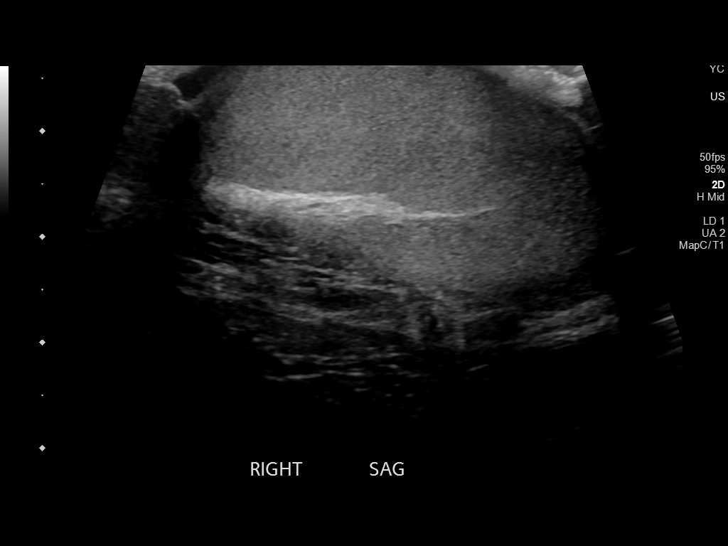
[im 16/63]
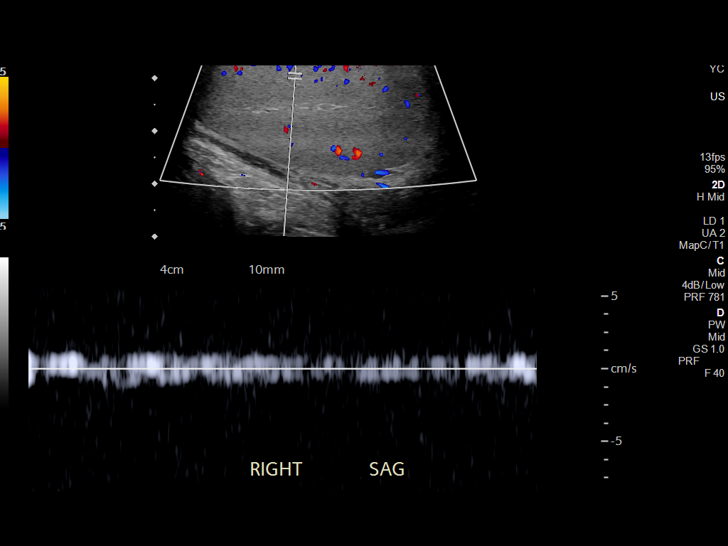
[im 21/63]
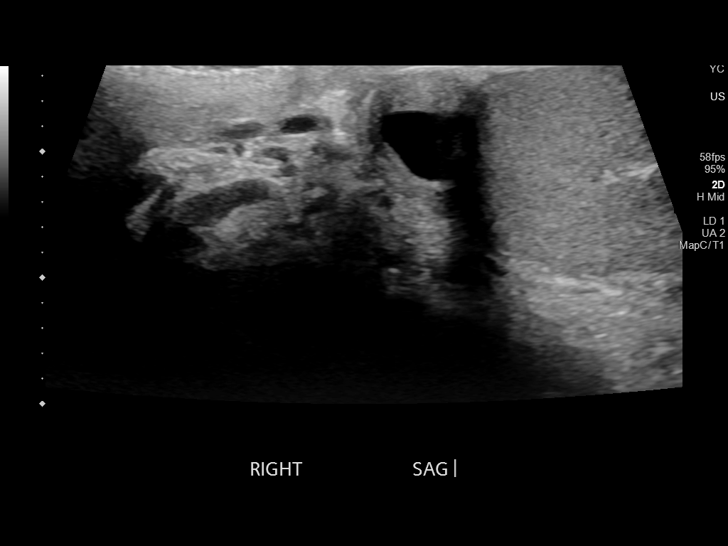
[im 24/63]
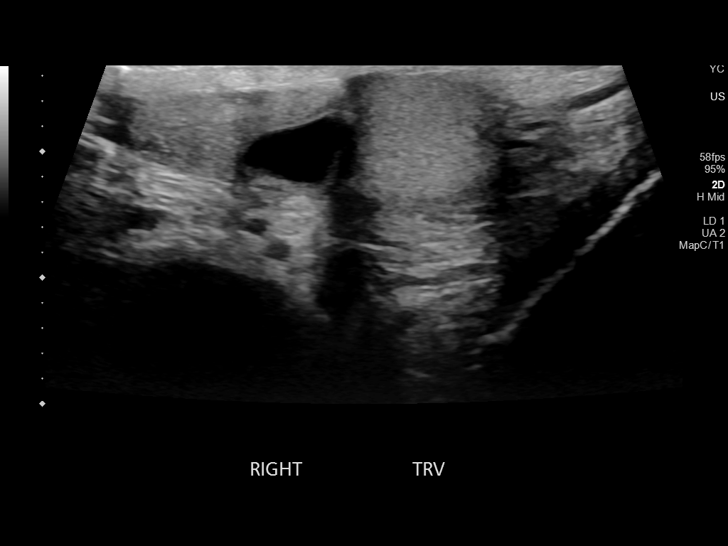
[im 29/63]
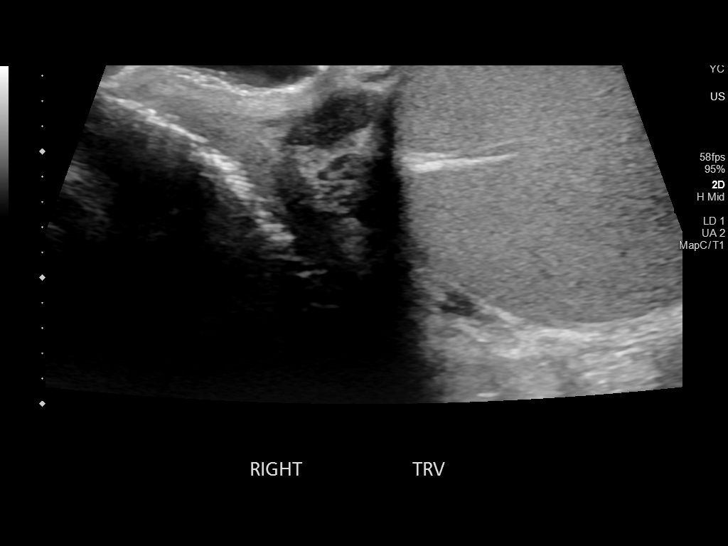
[im 34/63]
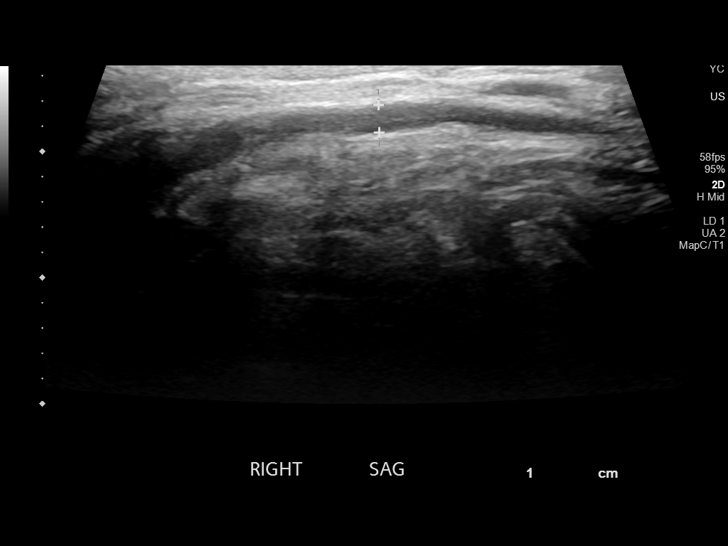
[im 39/63]
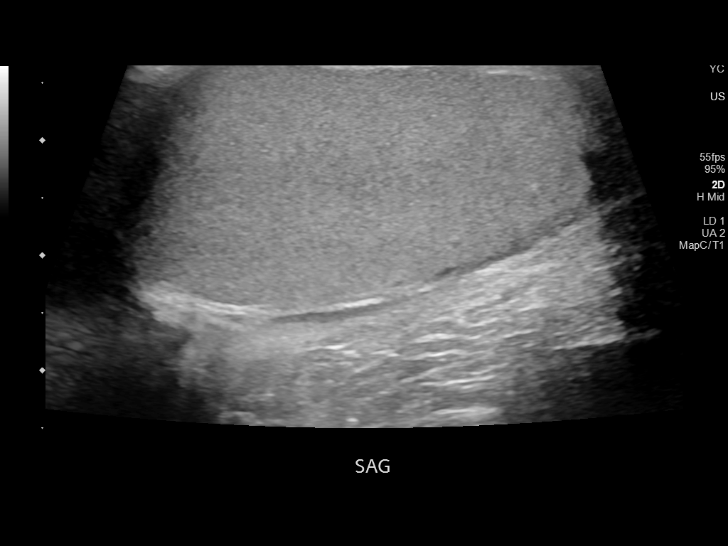
[im 42/63]
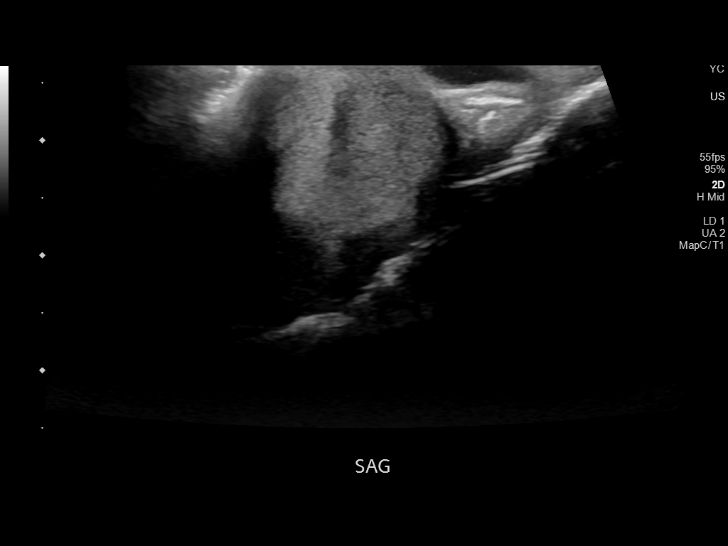
[im 47/63]
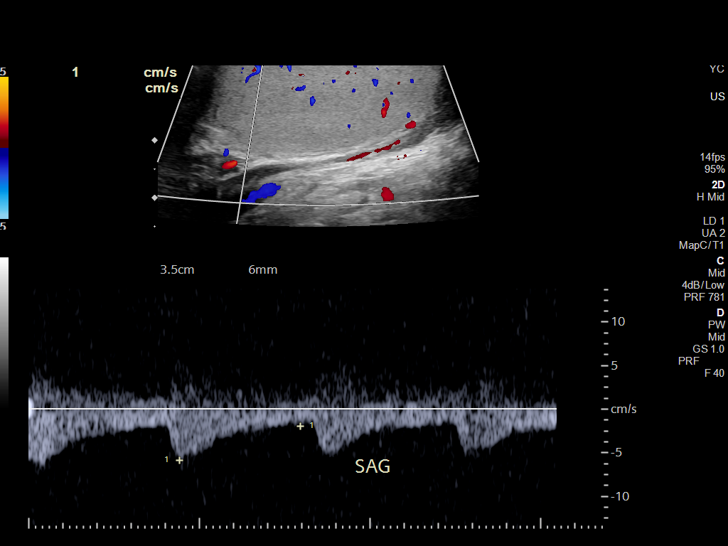
[im 52/63]
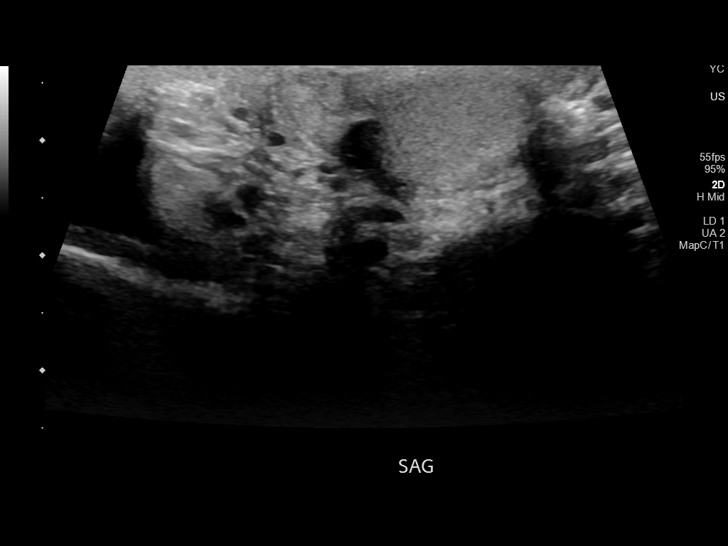
[im 57/63]
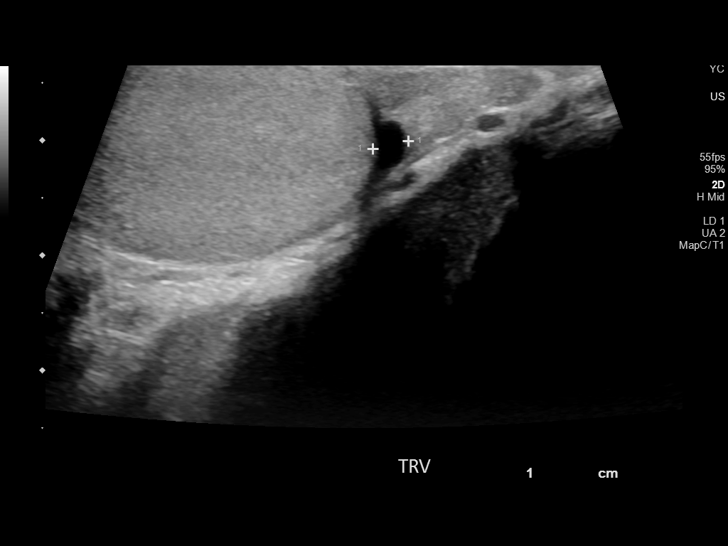
[im 63/63]
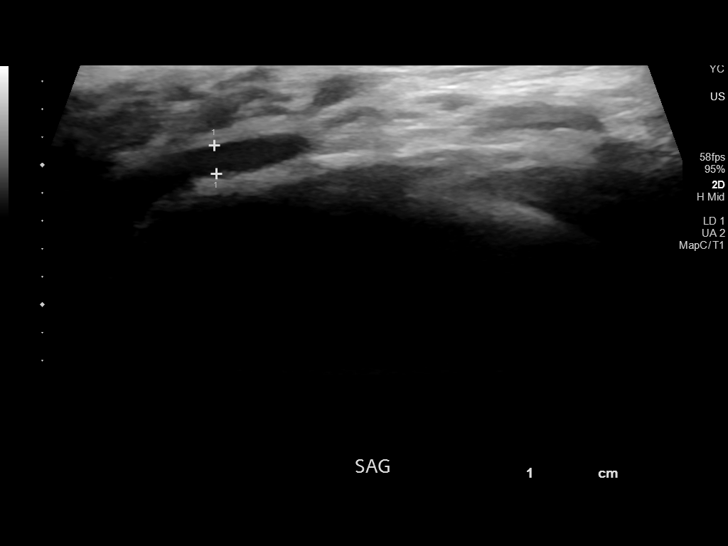

[14 of 25 positions shown; findings below may reference images not displayed]

FINDINGS: Right testicle

Measurements: 4.3 x 2.7 x 2.7 cm. No mass or microlithiasis
visualized.

Left testicle

Measurements: 3.9 x 2.1 x 2.7 cm. No mass or microlithiasis
visualized.

Right epididymis: Normal in size and appearance. Subcentimeter right
femoral head cyst.

Left epididymis: Normal in size and appearance. Subcentimeter left
epididymal head cyst.

Hydrocele:  Small right hydrocele.

Varicocele:  None visualized.

Pulsed Doppler interrogation of both testes demonstrates normal low
resistance arterial and venous waveforms bilaterally.
IMPRESSION: 1. Unremarkable testicles with bilateral Doppler detected flow.
2. Small right hydrocele.
3. Small bilateral epididymal head cysts.

## 2020-07-01 ENCOUNTER — Other Ambulatory Visit: Payer: Self-pay

## 2020-07-01 ENCOUNTER — Telehealth: Payer: Self-pay | Admitting: Gastroenterology

## 2020-07-01 MED ORDER — PANTOPRAZOLE SODIUM 20 MG PO TBEC: 20.0000 mg | DELAYED_RELEASE_TABLET | Freq: Every day | ORAL | 3 refills | Status: DC

## 2020-07-01 NOTE — Telephone Encounter (Signed)
Pt needs refill on Panpoprazole please. Call into [facility] on [address]

## 2020-07-01 NOTE — Telephone Encounter (Signed)
Prescription refill for pantoprazole has been sent to pt's pharmacy.

## 2020-07-22 ENCOUNTER — Other Ambulatory Visit: Payer: Self-pay | Admitting: Physical Medicine & Rehabilitation

## 2020-07-22 NOTE — Telephone Encounter (Signed)
I have refilled the medication, but I will also need you to schedule follow-up appointment.  The other option is for your primary care physician to take over the prescription.  Thanks.

## 2020-08-20 ENCOUNTER — Other Ambulatory Visit: Payer: Self-pay | Admitting: Gastroenterology

## 2020-08-22 ENCOUNTER — Telehealth: Payer: Self-pay | Admitting: Gastroenterology

## 2020-08-22 NOTE — Telephone Encounter (Signed)
Patient's wife called asking for Dicyclomine 10 mg  Refill.  Called into [facility]

## 2020-08-27 ENCOUNTER — Other Ambulatory Visit: Payer: Self-pay

## 2020-08-27 MED ORDER — DICYCLOMINE HCL 10 MG PO CAPS: ORAL_CAPSULE | ORAL | 0 refills | Status: DC

## 2020-08-27 NOTE — Telephone Encounter (Signed)
Prescription sent to pt's pharmacy. No additional refills until office appt.

## 2020-10-08 ENCOUNTER — Telehealth: Payer: Self-pay | Admitting: Gastroenterology

## 2020-10-08 NOTE — Telephone Encounter (Signed)
dicyclomine (BENTYL) 10 MG capsule 90 days  [facility] on [address]

## 2020-10-21 ENCOUNTER — Telehealth: Payer: Self-pay | Admitting: Gastroenterology

## 2020-10-21 NOTE — Telephone Encounter (Signed)
Called LVM for patient to call back to make Rx refill appointment.

## 2020-10-21 NOTE — Telephone Encounter (Signed)
pantoprazole (PROTONIX) 20 MG tablet  [facility] on [address]  90 day refill  Patient has scheduled an appt.

## 2020-10-30 ENCOUNTER — Other Ambulatory Visit: Payer: Self-pay

## 2020-10-30 MED ORDER — PANTOPRAZOLE SODIUM 20 MG PO TBEC: 20.0000 mg | DELAYED_RELEASE_TABLET | Freq: Every day | ORAL | 0 refills | Status: DC

## 2020-10-30 NOTE — Telephone Encounter (Signed)
Precription for Pantoprazole 20mg  sent to [facility] on [address] 30 days supply only until appt.

## 2020-11-01 ENCOUNTER — Other Ambulatory Visit: Payer: Self-pay | Admitting: Gastroenterology

## 2020-11-12 ENCOUNTER — Other Ambulatory Visit: Payer: Self-pay

## 2020-11-12 ENCOUNTER — Encounter: Payer: Self-pay | Admitting: Gastroenterology

## 2020-11-12 ENCOUNTER — Ambulatory Visit (INDEPENDENT_AMBULATORY_CARE_PROVIDER_SITE_OTHER): Payer: Self-pay | Admitting: Gastroenterology

## 2020-11-12 VITALS — BP 128/81 | HR 85 | Ht 68.0 in | Wt 173.6 lb

## 2020-11-12 DIAGNOSIS — R1084 Generalized abdominal pain: Secondary | ICD-10-CM

## 2020-11-12 DIAGNOSIS — K219 Gastro-esophageal reflux disease without esophagitis: Secondary | ICD-10-CM

## 2020-11-12 MED ORDER — DICYCLOMINE HCL 10 MG PO CAPS: ORAL_CAPSULE | ORAL | 11 refills | Status: DC

## 2020-11-12 MED ORDER — PANTOPRAZOLE SODIUM 20 MG PO TBEC: 20.0000 mg | DELAYED_RELEASE_TABLET | Freq: Every day | ORAL | 11 refills | Status: DC

## 2020-11-12 NOTE — Progress Notes (Signed)
Primary Care Physician: Patient, No Pcp Per (Inactive)  Primary Gastroenterologist:  Dr. Midge Minium  Chief Complaint  Patient presents with  . Medication Refill    HPI: Lance Garcia is a 35 y.o. male here for follow-up and refill medication.  The patient has been doing well on his dicyclomine with much less pain.  The patient does report that certain foods including let us make his symptoms worse and he also reports that he does not eat spicy Doritos until the weekend, so he will not have pounds at work. He also reports that he needs a refill of his dicyclomine and Protonix.   Past Medical History:  Diagnosis Date  . Anxiety   . Family history of adverse reaction to anesthesia    PTS MOM-N/V  . Headache    h/o migraines, approx 2x/yr  . Heat stroke [address], [place]  . History of kidney stones   . Kidney stone     Current Outpatient Medications  Medication Sig Dispense Refill  . dicyclomine (BENTYL) 10 MG capsule TAKE 1 CAPSULE BY MOUTH THREE TIMES DAILY AS NEEDED FOR  SPASM (Patient taking differently: TAKE 1 CAPSULE BY MOUTH THREE TIMES DAILY AS NEEDED FOR  SPASM) 90 capsule 0  . gabapentin (NEURONTIN) 100 MG capsule TAKE 1 CAPSULE BY MOUTH THREE TIMES DAILY 90 capsule 5  . Multiple Vitamin (MULTIVITAMIN) tablet Take 1 tablet by mouth daily.    . pantoprazole (PROTONIX) 20 MG tablet Take 1 tablet (20 mg total) by mouth daily. **PLEASE SCHEDULE FOLLOW UP APPT** 30 tablet 0  . acetaminophen (TYLENOL) 500 MG tablet Take 500 mg by mouth every 6 (six) hours as needed for mild pain.  (Patient not taking: Reported on 11/12/2020)     No current facility-administered medications for this visit.    Allergies as of 11/12/2020  . (No Known Allergies)    ROS:  General: Negative for anorexia, weight loss, fever, chills, fatigue, weakness. ENT: Negative for hoarseness, difficulty swallowing , nasal congestion. CV: Negative for chest pain, angina, palpitations,  dyspnea on exertion, peripheral edema.  Respiratory: Negative for dyspnea at rest, dyspnea on exertion, cough, sputum, wheezing.  GI: See history of present illness. GU:  Negative for dysuria, hematuria, urinary incontinence, urinary frequency, nocturnal urination.  Endo: Negative for unusual weight change.    Physical Examination:   BP 128/81   Pulse 85   Ht 5\' 8"  (1.727 m)   Wt 173 lb 9.6 oz (78.7 kg)   BMI 26.40 kg/m   General: Well-nourished, well-developed in no acute distress.  Eyes: No icterus. Conjunctivae pink. Lungs: Clear to auscultation bilaterally. Non-labored. Heart: Regular rate and rhythm, no murmurs rubs or gallops.  Abdomen: Bowel sounds are normal, nontender, nondistended, no hepatosplenomegaly or masses, no abdominal bruits or hernia , no rebound or guarding.   Extremities: No lower extremity edema. No clubbing or deformities. Neuro: Alert and oriented x 3.  Grossly intact. Skin: Warm and dry, no jaundice.   Psych: Alert and cooperative, normal mood and affect.  Labs:    Imaging Studies: No results found.  Assessment and Plan:   Lance Garcia is a 35 y.o. y/o male who comes in for follow-up and refill medication.  The patient denies any unexplained weight loss fevers chills nausea vomiting black stools or bloody stools.  He has been doing well on his dicyclomine and Protonix.  The patient will have both of those refilled.  The patient and his wife  have been explained the plan and agree with it.     Midge Minium, MD. Clementeen Graham    Note: This dictation was prepared with Dragon dictation along with smaller phrase technology. Any transcriptional errors that result from this process are unintentional.

## 2021-02-17 ENCOUNTER — Other Ambulatory Visit: Payer: Self-pay | Admitting: Physical Medicine & Rehabilitation

## 2021-11-24 ENCOUNTER — Other Ambulatory Visit: Payer: Self-pay | Admitting: Gastroenterology

## 2021-11-28 ENCOUNTER — Other Ambulatory Visit: Payer: Self-pay | Admitting: Gastroenterology

## 2022-02-06 ENCOUNTER — Telehealth: Payer: Self-pay | Admitting: Gastroenterology

## 2022-02-06 ENCOUNTER — Other Ambulatory Visit: Payer: Self-pay | Admitting: Gastroenterology

## 2022-02-06 NOTE — Telephone Encounter (Signed)
Patient needs refill on dicyclomine 10 mg. Requesting a call back.

## 2022-02-09 MED ORDER — DICYCLOMINE HCL 10 MG PO CAPS: ORAL_CAPSULE | ORAL | 0 refills | Status: DC

## 2022-02-10 MED ORDER — DICYCLOMINE HCL 10 MG PO CAPS: ORAL_CAPSULE | ORAL | 3 refills | Status: DC

## 2022-02-10 NOTE — Addendum Note (Signed)
Addended by: Roena Malady on: 02/10/2022 06:35 PM   Modules accepted: Orders

## 2022-04-30 ENCOUNTER — Other Ambulatory Visit: Payer: Self-pay

## 2022-04-30 MED ORDER — DICYCLOMINE HCL 10 MG PO CAPS: ORAL_CAPSULE | ORAL | 1 refills | Status: DC

## 2022-06-16 ENCOUNTER — Ambulatory Visit: Payer: Self-pay | Admitting: Gastroenterology

## 2022-06-16 ENCOUNTER — Other Ambulatory Visit: Payer: Self-pay

## 2022-06-16 MED ORDER — PANTOPRAZOLE SODIUM 20 MG PO TBEC: 20.0000 mg | DELAYED_RELEASE_TABLET | Freq: Every day | ORAL | 1 refills | Status: DC

## 2022-08-12 ENCOUNTER — Telehealth (INDEPENDENT_AMBULATORY_CARE_PROVIDER_SITE_OTHER): Payer: Self-pay | Admitting: Gastroenterology

## 2022-08-12 ENCOUNTER — Encounter: Payer: Self-pay | Admitting: Gastroenterology

## 2022-08-12 VITALS — Wt 165.0 lb

## 2022-08-12 DIAGNOSIS — K219 Gastro-esophageal reflux disease without esophagitis: Secondary | ICD-10-CM

## 2022-08-12 DIAGNOSIS — K589 Irritable bowel syndrome without diarrhea: Secondary | ICD-10-CM

## 2022-08-12 MED ORDER — DICYCLOMINE HCL 10 MG PO CAPS: ORAL_CAPSULE | ORAL | 11 refills | Status: DC

## 2022-08-12 MED ORDER — PANTOPRAZOLE SODIUM 20 MG PO TBEC: 20.0000 mg | DELAYED_RELEASE_TABLET | Freq: Every day | ORAL | 11 refills | Status: DC

## 2022-08-12 NOTE — Addendum Note (Signed)
Addended by: Lurlean Nanny on: 08/12/2022 09:37 AM   Modules accepted: Orders

## 2022-08-12 NOTE — Progress Notes (Signed)
Lucilla Lame, MD [address]  [place]  [place], [place] [address]  Main: [phone]  Fax: [phone]    Gastroenterology Virtual/Video Visit  Referring Provider:     No ref. provider found Primary Care Physician:  Patient, No Pcp Per Primary Gastroenterologist:  Dr.Jeily Guthridge Allen Norris Reason for Consultation:     Medication refill        HPI:    Virtual Visit via Video Note Location of the patient:  Location of provider: Office  Participating persons: The patient and myself.  I connected with Lance Garcia on 08/12/22 at  9:45 AM EST by a video enabled telemedicine application and verified that I am speaking with the correct person using two identifiers.   I discussed the limitations of evaluation and management by telemedicine and the availability of in person appointments. The patient expressed understanding and agreed to proceed.  Verbal consent to proceed obtained.  History of Present Illness: Lance Garcia is a 37 y.o. male referred by Dr. Patient, No Pcp Per  for consultation & management of Irritable bowel syndrome.  The patient had seen me back in May 2022 for medication refill at that time.  The patient was doing better on dicyclomine.  The patient had reported that time that the dicyclomine was working to have less pain.  The patient also had Protonix refilled at that time. The patient denies any dysphagia black stools bloody stools fevers chills nausea or vomiting.  The patient also reports that he has had no unexplained weight loss.  He has been doing well on the medication and needs a refill of the medication.  Past Medical History:  Diagnosis Date   Anxiety    Family history of adverse reaction to anesthesia    PTS MOM-N/V   Headache    h/o migraines, approx 2x/yr   Heat stroke 2014   [facility], [place]   History of kidney stones    Kidney stone     Past Surgical History:  Procedure Laterality Date   COLONOSCOPY WITH PROPOFOL N/A 12/16/2018    Procedure: COLONOSCOPY WITH PROPOFOL;  Surgeon: Lucilla Lame, MD;  Location: [place];  Service: Endoscopy;  Laterality: N/A;   CYSTOSCOPY WITH STENT PLACEMENT Left 08/03/2016   Procedure: CYSTOSCOPY WITH STENT PLACEMENT;  Surgeon: Hollice Espy, MD;  Location: [facility];  Service: Urology;  Laterality: Left;   ESOPHAGOGASTRODUODENOSCOPY (EGD) WITH PROPOFOL N/A 12/16/2018   Procedure: ESOPHAGOGASTRODUODENOSCOPY (EGD) WITH PROPOFOL;  Surgeon: Lucilla Lame, MD;  Location: [place];  Service: Endoscopy;  Laterality: N/A;   KIDNEY STONE SURGERY     URETEROSCOPY WITH HOLMIUM LASER LITHOTRIPSY Left 08/03/2016   Procedure: URETEROSCOPY WITH HOLMIUM LASER LITHOTRIPSY;  Surgeon: Hollice Espy, MD;  Location: [facility];  Service: Urology;  Laterality: Left;    Prior to Admission medications   Medication Sig Start Date End Date Taking? Authorizing Provider  dicyclomine (BENTYL) 10 MG capsule TAKE 1 CAPSULE BY MOUTH THREE TIMES DAILY AS NEEDED FOR  SPASMS 04/30/22  Yes Lucilla Lame, MD  Multiple Vitamin (MULTIVITAMIN) tablet Take 1 tablet by mouth daily.   Yes [provider]  pantoprazole (PROTONIX) 20 MG tablet Take 1 tablet (20 mg total) by mouth daily. 06/16/22  Yes Lucilla Lame, MD    Family History  Problem Relation Age of Onset   Neuropathy Mother    Colon polyps Mother    Diabetes Mother    Throat cancer Father      Social History  Tobacco Use   Smoking status: Every Day    Packs/day: 1.00    Years: 15.00    Total pack years: 15.00    Types: Cigarettes   Smokeless tobacco: Former  Scientific laboratory technician Use: Never used  Substance Use Topics   Alcohol use: No    Comment: social   Drug use: Not Currently    Allergies as of 08/12/2022   (No Known Allergies)    Review of Systems:    All systems reviewed and negative except where noted in HPI.   Observations/Objective:  Labs: CBC    Component Value Date/Time   WBC 12.3 (H) 12/12/2018 0348    RBC 4.78 12/12/2018 0348   HGB 14.2 12/12/2018 0348   HGB 14.9 11/09/2014 0849   HCT 42.0 12/12/2018 0348   HCT 44.7 11/09/2014 0849   PLT 281 12/12/2018 0348   PLT 243 11/09/2014 0849   MCV 87.9 12/12/2018 0348   MCV 88 11/09/2014 0849   MCH 29.7 12/12/2018 0348   MCHC 33.8 12/12/2018 0348   RDW 12.0 12/12/2018 0348   RDW 12.7 11/09/2014 0849   LYMPHSABS 3.9 (H) 05/06/2016 0837   LYMPHSABS 4.3 (H) 11/22/2013 0811   MONOABS 1.0 05/06/2016 0837   MONOABS 1.4 (H) 11/22/2013 0811   EOSABS 0.2 05/06/2016 0837   EOSABS 0.2 11/22/2013 0811   BASOSABS 0.1 05/06/2016 0837   BASOSABS 0.1 11/22/2013 0811   CMP     Component Value Date/Time   NA 138 12/12/2018 0348   NA 137 11/09/2014 0849   K 4.0 12/12/2018 0348   K 4.3 11/09/2014 0849   CL 105 12/12/2018 0348   CL 103 11/09/2014 0849   CO2 24 12/12/2018 0348   CO2 24 11/09/2014 0849   GLUCOSE 99 12/12/2018 0348   GLUCOSE 99 11/09/2014 0849   BUN 11 12/12/2018 0348   BUN 12 11/09/2014 0849   CREATININE 0.76 12/12/2018 0348   CREATININE 1.04 11/09/2014 0849   CALCIUM 9.4 12/12/2018 0348   CALCIUM 9.6 11/09/2014 0849   PROT 7.4 12/12/2018 0348   PROT 7.9 11/09/2014 0849   ALBUMIN 4.7 12/12/2018 0348   ALBUMIN 4.8 11/09/2014 0849   AST 22 12/12/2018 0348   AST 26 11/09/2014 0849   ALT 18 12/12/2018 0348   ALT 23 11/09/2014 0849   ALKPHOS 83 12/12/2018 0348   ALKPHOS 87 11/09/2014 0849   BILITOT 0.6 12/12/2018 0348   BILITOT 0.8 11/09/2014 0849   GFRNONAA >60 12/12/2018 0348   GFRNONAA >60 11/09/2014 0849   GFRAA >60 12/12/2018 0348   GFRAA >60 11/09/2014 0849    Imaging Studies: No results found.  Assessment and Plan:   Lance Garcia is a 37 y.o. y/o male has been referred for Who comes in today with a history of irritable bowel syndrome with abdominal pain relief with dicyclomine.  He does have to double up on his Protonix when he gets some heartburn but he states that it does help his symptoms.  The patient  will have both of these refilled for a year supply.  The patient has been asked by the plan and agrees with it.  Follow Up Instructions:  I discussed the assessment and treatment plan with the patient. The patient was provided an opportunity to ask questions and all were answered. The patient agreed with the plan and demonstrated an understanding of the instructions.   The patient was advised to call back or seek an in-person evaluation if the symptoms worsen  or if the condition fails to improve as anticipated.  I provided 20 minutes of non-face-to-face time during this encounter including chart review In preparation for the encounter.   Lucilla Lame, MD  Speech recognition software was used to dictate the above note.

## 2023-08-16 ENCOUNTER — Other Ambulatory Visit: Payer: Self-pay | Admitting: Gastroenterology

## 2023-09-14 ENCOUNTER — Other Ambulatory Visit: Payer: Self-pay | Admitting: Gastroenterology

## 2023-10-24 ENCOUNTER — Other Ambulatory Visit: Payer: Self-pay | Admitting: Gastroenterology

## 2023-12-16 ENCOUNTER — Other Ambulatory Visit: Payer: Self-pay | Admitting: Gastroenterology

## 2024-01-31 ENCOUNTER — Other Ambulatory Visit: Payer: Self-pay

## 2024-01-31 MED ORDER — DICYCLOMINE HCL 10 MG PO CAPS: ORAL_CAPSULE | ORAL | 1 refills | Status: DC

## 2024-04-26 ENCOUNTER — Other Ambulatory Visit: Payer: Self-pay | Admitting: Gastroenterology

## 2024-08-15 ENCOUNTER — Other Ambulatory Visit: Payer: Self-pay | Admitting: Gastroenterology

## 2024-09-18 ENCOUNTER — Ambulatory Visit: Payer: Self-pay
